# Patient Record
Sex: Male | Born: 1960 | Race: Black or African American | Hispanic: No | Marital: Married | Smoking: Former smoker
Health system: Southern US, Community
[De-identification: ages and names within clinical notes are randomized; demographics above are authoritative.]

## PROBLEM LIST (undated history)

## (undated) DIAGNOSIS — I639 Cerebral infarction, unspecified: Secondary | ICD-10-CM

## (undated) DIAGNOSIS — G4730 Sleep apnea, unspecified: Secondary | ICD-10-CM

## (undated) DIAGNOSIS — E119 Type 2 diabetes mellitus without complications: Secondary | ICD-10-CM

## (undated) DIAGNOSIS — G47419 Narcolepsy without cataplexy: Secondary | ICD-10-CM

## (undated) DIAGNOSIS — H409 Unspecified glaucoma: Secondary | ICD-10-CM

## (undated) DIAGNOSIS — I10 Essential (primary) hypertension: Secondary | ICD-10-CM

## (undated) HISTORY — PX: HAND TENDON SURGERY: SHX663

---

## 2008-10-22 ENCOUNTER — Ambulatory Visit: Payer: Self-pay | Admitting: Internal Medicine

## 2008-10-22 DIAGNOSIS — J90 Pleural effusion, not elsewhere classified: Secondary | ICD-10-CM | POA: Insufficient documentation

## 2008-10-22 DIAGNOSIS — I10 Essential (primary) hypertension: Secondary | ICD-10-CM | POA: Insufficient documentation

## 2008-10-27 LAB — CONVERTED CEMR LAB
Basophils Relative: 5.7 % — ABNORMAL HIGH (ref 0.0–3.0)
Eosinophils Absolute: 0.2 10*3/uL (ref 0.0–0.7)
HCT: 36.3 % — ABNORMAL LOW (ref 39.0–52.0)
Hemoglobin: 12.4 g/dL — ABNORMAL LOW (ref 13.0–17.0)
MCHC: 34.1 g/dL (ref 30.0–36.0)
MCV: 92.7 fL (ref 78.0–100.0)
Monocytes Absolute: 0.3 10*3/uL (ref 0.1–1.0)
Neutro Abs: 3.2 10*3/uL (ref 1.4–7.7)
RBC: 3.92 M/uL — ABNORMAL LOW (ref 4.22–5.81)

## 2008-11-07 ENCOUNTER — Ambulatory Visit: Payer: Self-pay | Admitting: Internal Medicine

## 2008-11-10 LAB — CONVERTED CEMR LAB
BUN: 23 mg/dL (ref 6–23)
Basophils Relative: 0.1 % (ref 0.0–3.0)
Creatinine, Ser: 1.2 mg/dL (ref 0.4–1.5)
Eosinophils Absolute: 0.1 10*3/uL (ref 0.0–0.7)
GFR calc non Af Amer: 83.27 mL/min (ref >60)
MCHC: 33.7 g/dL (ref 30.0–36.0)
MCV: 94.2 fL (ref 78.0–100.0)
Monocytes Absolute: 0.2 10*3/uL (ref 0.1–1.0)
Neutrophils Relative %: 46.3 % (ref 43.0–77.0)
Platelets: 235 10*3/uL (ref 150.0–400.0)
Potassium: 4.6 meq/L (ref 3.5–5.1)
RBC: 3.93 M/uL — ABNORMAL LOW (ref 4.22–5.81)
Sed Rate: 32 mm/hr — ABNORMAL HIGH (ref 0–22)

## 2008-12-12 ENCOUNTER — Ambulatory Visit: Payer: Self-pay | Admitting: Internal Medicine

## 2010-02-16 ENCOUNTER — Inpatient Hospital Stay (HOSPITAL_COMMUNITY)
Admission: EM | Admit: 2010-02-16 | Discharge: 2010-02-19 | Payer: Self-pay | Source: Home / Self Care | Attending: Internal Medicine | Admitting: Internal Medicine

## 2010-02-17 ENCOUNTER — Encounter (INDEPENDENT_AMBULATORY_CARE_PROVIDER_SITE_OTHER): Payer: Self-pay | Admitting: Emergency Medicine

## 2010-03-01 ENCOUNTER — Encounter
Admission: RE | Admit: 2010-03-01 | Discharge: 2010-03-30 | Payer: Self-pay | Source: Home / Self Care | Attending: Neurology | Admitting: Neurology

## 2010-03-22 NOTE — Discharge Summary (Addendum)
Cory Carpenter, Cory Carpenter                ACCOUNT NO.:  [id number]  MEDICAL RECORD NO.:  [id number]          PATIENT TYPE:  INP  LOCATION:  3009                         FACILITY:  [facility]  PHYSICIAN:  Isidor Holts, M.D.  DATE OF BIRTH:  10-14-1960  DATE OF ADMISSION:  02/16/2010 DATE OF DISCHARGE:  02/19/2010                              DISCHARGE SUMMARY   PRIMARY MEDICAL DOCTOR:  [place], [place].  DISCHARGE DIAGNOSES: 1. Subacute right frontoparietal cerebrovascular accident. 2. Dysphasia secondary to subacute right frontoparietal     cerebrovascular accident. 3. Type 2 diabetes mellitus. 4. Hyperosmolar nonketotic state secondary to above. 5. Dehydration/acute renal insufficiency secondary to above. 6. Hypertension. 7. Dyslipidemia.  PROCEDURES: 1. Chest x-ray on February 16, 2010.  This showed no acute bony     pathology.  There was chronic change. 2. Head CT scan on February 16, 2010.  This showed acute versus  subacute ischemic changes present in the right frontal and parietal     lobes.  This is most consistent with right ICA watershed infarcts. 3. Brain MRI on February 18, 2010.  This showed multiple areas of     acute infarction in the right middle cerebral artery territory,     mild swelling, and minimal petechial blood products.  No     significant mass effect or shift or evidence of frank hematoma. 4. Brain MRA on February 18, 2010.  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  There is     branch vessel visualization beyond that.  This could be due to     embolic or thrombotic disease approximately 50% stenosis of the A1     segment on the right.  Also middle cerebral artery branch stenosis     and the left posterior cerebral artery stenosis bilaterally. 5. Two-D echocardiogram on February 17, 2010.  This showed severely     dilated left ventricular cavity size, systolic function was normal.     Estimated ejection  fraction 60%-65%.  Wall motion was normal.  Left     ventricular diastolic function parameters were normal. 6. Transesophageal echocardiography on February 19, 2010 done by Dr.     Olga Millers.  This showed normal LV function and prominent     trabeculae at the LV apex, normal LA.  No left atrial appendage     thrombus.  Normal RA.  Normal RV.  No pericardial effusion.  No     aortic stenosis or insufficiency.  Normal atrial valve.  Trace     mitral regurgitation.  Normal tricuspid valve.  Trace tricuspid     regurgitation.  No pulmonic insufficiency.  No intraatrial shunt by     color Doppler.  Negative saline microcavitation study.  CONSULTATIONS: 1. C. Lesia Sago, MD, neurologist. 2. Madolyn Frieze. Jens Som, MD, [facility], cardiologist.  ADMISSION HISTORY:  As H&P notes of February 16, 2010, dictated by Dr. Marcellus Scott.  However, in brief, this is a 50 year old male, with known history of type 2 diabetes mellitus, hypertension, and history of left arm surgery for trauma, presenting with left  facial weakness and twitching and left hand weakness.  Reportedly, the patient is experiencing some slurring of speech and left-sided facial asymmetry approximately 2 weeks ago, associated with weakness in the left hand, and difficulyt with dressing himself.  This has become progressive.  On initial evaluation, the patient was found to have blood glucose of 677. Head CT scan demonstrated a right cerebral CVA.  The patient was therefore admitted for further evaluation, investigation, and management.  CLINICAL COURSE: 1. Subacute right MCA territory watershed infarct:  The patient     presented as described above.  Imaging studies including head CT     scan and brain MRI demonstrated a subacute CVA in the right MCA     territory with a watershed distribution.  For details, refer to     procedure list above.  The patient was placed on neuro checks, underwent     physiotherapy evaluation.  Risk  factor modification was pursued.     The patient was commenced on low-dose aspirin.  Initial neurology     consultation was provided by Dr. Lesia Sago, who was helpful in     directing management and therapy.  The patient has indeed made a     good recovery.  By February 20, 1999, he only had minimal mild     residual left facial asymmetry as well as minimal deficit in the     left upper extremity.  The patient has been evaluated by speech     pathologist and is recommended outpatient speech therapy for     cognition and dysarthria.  2. Dyslipidemia:  The patient's lipid profile are as follows.  Total     cholesterol 116, triglyceride 133, HDL 30, and LDL 59, i.e.,     excellent lipid profile.  He continues on preadmission lipid-     lowering treatment.  3. Hyperosmolar nonketotic state:  The patient presented with a     markedly elevated blood glucose of 677, BUN was 26, creatinine     1.56, and sodium 129.  Findings were consistent with hyperosmolar     nonketotic state.  It was managed with intravenous fluid hydration,     sliding-scale insulin coverage, as well as scheduled Lantus insulin.     By February 17, 2010, CBGs were ranging in the 200s-300s.  He was     recommenced on home Lantus regimen, as well as carbohydrate-modified     diet. We are pleased to note  that during the rest of the course     of the patient's hospitalization, his CBGs improved.  As a matter     of fact, it was 110 in the a.m. of February 19, 2010.  Hydration     status has since normalized.  As of February 18, 2010, his BUN was     13 and creatinine 0.96.  4. Hypertension:  The patient's blood pressure was initially mildly     elevated at the time of presentation.  Against a background of     recent acute CVA, it was deemed not to aggressively pursue blood     pressure control.  His lisinopril dosage was therefore reduced to 5     mg p.o. daily.  As of February 19, 2010, BP was 143/75.  His      lisinopril has been changed to 10 mg p.o. daily for now.  DISPOSITION:  The patient was on February 19, 2010, considered clinically stable for discharge.  He was therefore discharged  accordingly.  ACTIVITY:  As tolerated.  Recommended to increase activity slowly.  DIET:  Heart healthy/carbohydrate modified.  FOLLOWUP INSTRUCTIONS:  The patient is to follow up routinely with his primary MD at [facility], [place] per prior scheduled appointment.  He is to follow up with Dr. Delia Heady, [facility], i.e., stroke MD in 2 weeks.  Telephone number [phone].  SPECIAL INSTRUCTIONS:  Outpatient speech therapy for cognition and dysarthria have been arranged.     Isidor Holts, M.D.     CO/MEDQ  D:  02/19/2010  T:  02/20/2010  Job:  [id number]  cc:   [facility] [place] Pramod P. Pearlean Brownie, MD Nile Riggs, MD  Electronically Signed by Isidor Holts M.D. on 03/22/2010 07:19:34 PM

## 2010-03-25 NOTE — Consult Note (Addendum)
NAME:  Cory Carpenter, Cory Carpenter                ACCOUNT NO.:  [id number]  MEDICAL RECORD NO.:  [id number]          PATIENT TYPE:  INP  LOCATION:  3009                         FACILITY:  [facility]  PHYSICIAN:  Marlan Palau, M.D.  DATE OF BIRTH:  02/21/1961  DATE OF CONSULTATION:  02/16/2010 DATE OF DISCHARGE:                                CONSULTATION   TIME OF CONSULTATION:  2 p.m.  REASON FOR CONSULTATION:  Left arm weakness, left facial droop, and slurred speech.  HISTORY OF PRESENT ILLNESS:  This is a 50 year old African American male with hypertension, diabetes, and questionable hypercholesterolemia.  The patient's wife noted over the past week or so, her husband showed weakness and clumsiness in his left hand.  Inattentiveness and questionable periods of being in the daze.  In addition, the patient has also showed some slurred speech over the last week.  Initially, wife had brought these symptoms up to her husband who at that time had stated he was fine and that they did not go to the hospital.  Today, his wife continued to notice left hand clumsiness, left facial droop, and slurred speech along with additional symptom of facial twitching.  During this facial twitching, the patient was able to respond and follow all instructions.  No incontinence or tongue biting occurred.  Due to the addition of the facial twitching, the patient's wife called EMS, so that he may be seen at the emergency department.  At the present time, the patient presents with slurred speech, left hand clumsiness, and positive left facial droop.  It should be noted, the patient has not been on and is not on aspirin or antiplatelet.  The patient does not have a history of stroke in the past.  NIH stroke scale is 4.  Modified Rankin is 0.  IV t-PA was not given secondary to unknown time of onset, delay in arrival.  PAST MEDICAL HISTORY: 1. Hypertension. 2. Diabetes. 3. Hypercholesterolemia.  PAST SURGICAL  HISTORY:  Left wrist surgery secondary to accident at 50 years of age.  MEDICATIONS:  Gemfibrozil, glipizide, Lantus, lisinopril.  ALLERGIES:  No known drug allergies.  FAMILY HISTORY:  Hypertension, diabetes, renal failure, and asthma.  SOCIAL HISTORY:  The patient does not smoke.  He does not do any illicit drugs.  He lives with his wife.  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  REVIEW OF SYSTEMS:  Positive for weakness, polyuria, left hand clumsiness, left arm weakness, slurred speech.  PHYSICAL EXAMINATION:  VITAL SIGNS:  Blood pressure is 164/79, pulse 76, respirations 27, temperature 98.4, O2 sat 97% on room air. NEUROLOGIC:  The patient is alert and oriented x3.  Carries out two- and three-step commands without difficulty.  Pupils are equal, round, and reactive to light and accommodating, conjugate.  Extraocular movements are intact.  Visual fields are grossly intact.  Face shows a left facial droop that includes V1, V2, and V3 region.  Tongue is midline.  UA is midline.  Speech has slight dysarthria.  The patient states his facial sensation is intact bilaterally. Coordination:  The patient's finger-to-nose and heel-to-shin were smooth; however, I  did notice some clumsiness in his left finger-to- finger fine motor movements.  Motor shows 5/5 strength throughout.  Deep tendon reflexes 2+, downgoing toes bilaterally.  Drift:  The patient shows no drift in the upper or lower extremities.  Sensation is full to pinprick, light touch, and vibration throughout. PULMONARY:  Clear to auscultation bilaterally.  No rhonchi or wheezing. CARDIOVASCULAR:  S1 and S2 is audible.  Regular rate and rhythm. NECK:  Negative bruits and supple.  LABORATORY DATA:  UA shows positive for glucose, ketones.  Sodium 129, potassium 4.9, chloride 91, CO2 of 28, BUN 26, creatinine 1.56, glucose is 677, calcium 9.5.  White blood cell count 6.4, platelets 190, hemoglobin 14.5, hematocrit 40.5.  IMAGING:   CT of head without contrast shows acute or subacute ischemic changes in the right frontal and right parietal lobe, most notably in the right ICA watershed area.  ASSESSMENT:  This is a 50 year old male with right frontoparietal watershed distribution infarct in the setting of hyperglycemia and dehydration.  RECOMMENDATIONS AT THIS TIME:  Start aspirin 325 mg p.o. once he passes swallow screen stroke workup.  PT/OT and speech therapy evaluation. Stroke MD will follow in the morning.     Felicie Morn, PA-C   ______________________________ C. Lesia Sago, M.D.    DS/MEDQ  D:  02/16/2010  T:  02/17/2010  Job:  [id number]  cc:   Pramod P. Pearlean Brownie, MD  Electronically Signed by Felicie Morn PA-C on 02/24/2010 10:05:31 AM Electronically Signed by Thana Farr M.D. on 03/25/2010 08:50:28 AM

## 2010-04-02 ENCOUNTER — Ambulatory Visit: Payer: Federal, State, Local not specified - PPO | Attending: Neurology

## 2010-04-02 ENCOUNTER — Ambulatory Visit: Payer: Federal, State, Local not specified - PPO | Admitting: Occupational Therapy

## 2010-04-02 DIAGNOSIS — IMO0001 Reserved for inherently not codable concepts without codable children: Secondary | ICD-10-CM | POA: Insufficient documentation

## 2010-04-02 DIAGNOSIS — I69919 Unspecified symptoms and signs involving cognitive functions following unspecified cerebrovascular disease: Secondary | ICD-10-CM | POA: Insufficient documentation

## 2010-04-02 DIAGNOSIS — I69922 Dysarthria following unspecified cerebrovascular disease: Secondary | ICD-10-CM | POA: Insufficient documentation

## 2010-04-14 ENCOUNTER — Ambulatory Visit: Payer: Federal, State, Local not specified - PPO

## 2010-04-15 ENCOUNTER — Ambulatory Visit: Payer: Federal, State, Local not specified - PPO | Admitting: Occupational Therapy

## 2010-04-16 ENCOUNTER — Ambulatory Visit: Payer: Federal, State, Local not specified - PPO

## 2010-04-20 ENCOUNTER — Ambulatory Visit: Payer: Federal, State, Local not specified - PPO

## 2010-04-20 ENCOUNTER — Encounter: Payer: Federal, State, Local not specified - PPO | Admitting: Occupational Therapy

## 2010-04-21 ENCOUNTER — Encounter: Payer: Federal, State, Local not specified - PPO | Admitting: Occupational Therapy

## 2010-04-22 ENCOUNTER — Ambulatory Visit: Payer: Federal, State, Local not specified - PPO

## 2010-04-23 ENCOUNTER — Encounter: Payer: Federal, State, Local not specified - PPO | Admitting: Occupational Therapy

## 2010-04-27 ENCOUNTER — Encounter: Payer: Federal, State, Local not specified - PPO | Admitting: Occupational Therapy

## 2010-04-30 ENCOUNTER — Encounter: Payer: Federal, State, Local not specified - PPO | Admitting: Occupational Therapy

## 2010-05-04 ENCOUNTER — Encounter: Payer: Federal, State, Local not specified - PPO | Admitting: Occupational Therapy

## 2010-05-07 ENCOUNTER — Encounter: Payer: Federal, State, Local not specified - PPO | Admitting: Occupational Therapy

## 2010-05-10 ENCOUNTER — Encounter: Payer: Federal, State, Local not specified - PPO | Admitting: Occupational Therapy

## 2010-05-10 ENCOUNTER — Ambulatory Visit: Payer: Federal, State, Local not specified - PPO | Admitting: Occupational Therapy

## 2010-05-10 ENCOUNTER — Ambulatory Visit: Payer: Federal, State, Local not specified - PPO | Attending: Neurology

## 2010-05-10 DIAGNOSIS — I69922 Dysarthria following unspecified cerebrovascular disease: Secondary | ICD-10-CM | POA: Insufficient documentation

## 2010-05-10 DIAGNOSIS — IMO0001 Reserved for inherently not codable concepts without codable children: Secondary | ICD-10-CM | POA: Insufficient documentation

## 2010-05-10 DIAGNOSIS — I69919 Unspecified symptoms and signs involving cognitive functions following unspecified cerebrovascular disease: Secondary | ICD-10-CM | POA: Insufficient documentation

## 2010-05-10 LAB — CBC
HCT: 40.5 % (ref 39.0–52.0)
Hemoglobin: 14.5 g/dL (ref 13.0–17.0)
MCH: 30 pg (ref 26.0–34.0)
MCHC: 34.3 g/dL (ref 30.0–36.0)
MCV: 87.4 fL (ref 78.0–100.0)
Platelets: 188 10*3/uL (ref 150–400)
WBC: 6.4 10*3/uL (ref 4.0–10.5)

## 2010-05-10 LAB — BASIC METABOLIC PANEL
BUN: 13 mg/dL (ref 6–23)
BUN: 13 mg/dL (ref 6–23)
BUN: 13 mg/dL (ref 6–23)
BUN: 15 mg/dL (ref 6–23)
BUN: 20 mg/dL (ref 6–23)
CO2: 24 mEq/L (ref 19–32)
CO2: 24 mEq/L (ref 19–32)
CO2: 26 mEq/L (ref 19–32)
CO2: 27 mEq/L (ref 19–32)
CO2: 28 mEq/L (ref 19–32)
Calcium: 8 mg/dL — ABNORMAL LOW (ref 8.4–10.5)
Calcium: 8.2 mg/dL — ABNORMAL LOW (ref 8.4–10.5)
Calcium: 8.4 mg/dL (ref 8.4–10.5)
Chloride: 103 mEq/L (ref 96–112)
Chloride: 104 mEq/L (ref 96–112)
Chloride: 105 mEq/L (ref 96–112)
Chloride: 105 mEq/L (ref 96–112)
Chloride: 106 mEq/L (ref 96–112)
Chloride: 109 mEq/L (ref 96–112)
Chloride: 113 mEq/L — ABNORMAL HIGH (ref 96–112)
Creatinine, Ser: 1.05 mg/dL (ref 0.4–1.5)
Creatinine, Ser: 1.06 mg/dL (ref 0.4–1.5)
Creatinine, Ser: 1.11 mg/dL (ref 0.4–1.5)
Creatinine, Ser: 1.12 mg/dL (ref 0.4–1.5)
Creatinine, Ser: 1.14 mg/dL (ref 0.4–1.5)
Creatinine, Ser: 1.25 mg/dL (ref 0.4–1.5)
GFR calc Af Amer: 58 mL/min — ABNORMAL LOW (ref >60)
GFR calc Af Amer: >60 mL/min (ref >60)
GFR calc Af Amer: >60 mL/min (ref >60)
GFR calc Af Amer: >60 mL/min (ref >60)
GFR calc Af Amer: >60 mL/min (ref >60)
GFR calc non Af Amer: 48 mL/min — ABNORMAL LOW (ref >60)
GFR calc non Af Amer: >60 mL/min (ref >60)
GFR calc non Af Amer: >60 mL/min (ref >60)
GFR calc non Af Amer: >60 mL/min (ref >60)
GFR calc non Af Amer: >60 mL/min (ref >60)
Glucose, Bld: 108 mg/dL — ABNORMAL HIGH (ref 70–99)
Glucose, Bld: 110 mg/dL — ABNORMAL HIGH (ref 70–99)
Glucose, Bld: 200 mg/dL — ABNORMAL HIGH (ref 70–99)
Glucose, Bld: 311 mg/dL — ABNORMAL HIGH (ref 70–99)
Glucose, Bld: 315 mg/dL — ABNORMAL HIGH (ref 70–99)
Glucose, Bld: 398 mg/dL — ABNORMAL HIGH (ref 70–99)
Potassium: 3.7 mEq/L (ref 3.5–5.1)
Potassium: 4 mEq/L (ref 3.5–5.1)
Potassium: 4.1 mEq/L (ref 3.5–5.1)
Potassium: 4.1 mEq/L (ref 3.5–5.1)
Potassium: 4.2 mEq/L (ref 3.5–5.1)
Potassium: 4.4 mEq/L (ref 3.5–5.1)
Potassium: 4.7 mEq/L (ref 3.5–5.1)
Potassium: 4.9 mEq/L (ref 3.5–5.1)
Sodium: 129 mEq/L — ABNORMAL LOW (ref 135–145)
Sodium: 134 mEq/L — ABNORMAL LOW (ref 135–145)
Sodium: 136 mEq/L (ref 135–145)
Sodium: 137 mEq/L (ref 135–145)
Sodium: 138 mEq/L (ref 135–145)
Sodium: 139 mEq/L (ref 135–145)

## 2010-05-10 LAB — RAPID URINE DRUG SCREEN, HOSP PERFORMED
Amphetamines: NOT DETECTED
Barbiturates: NOT DETECTED
Cocaine: NOT DETECTED
Opiates: NOT DETECTED
Tetrahydrocannabinol: NOT DETECTED

## 2010-05-10 LAB — DIFFERENTIAL
Basophils Absolute: 0 10*3/uL (ref 0.0–0.1)
Lymphocytes Relative: 39 % (ref 12–46)
Monocytes Absolute: 0.2 10*3/uL (ref 0.1–1.0)
Monocytes Relative: 4 % (ref 3–12)
Neutro Abs: 3.6 10*3/uL (ref 1.7–7.7)
Neutrophils Relative %: 56 % (ref 43–77)

## 2010-05-10 LAB — HEPATIC FUNCTION PANEL
ALT: 19 U/L (ref 0–53)
AST: 25 U/L (ref 0–37)
Alkaline Phosphatase: 88 U/L (ref 39–117)
Indirect Bilirubin: 0.3 mg/dL (ref 0.3–0.9)
Total Protein: 7.3 g/dL (ref 6.0–8.3)

## 2010-05-10 LAB — GLUCOSE, CAPILLARY
Glucose-Capillary: 129 mg/dL — ABNORMAL HIGH (ref 70–99)
Glucose-Capillary: 222 mg/dL — ABNORMAL HIGH (ref 70–99)
Glucose-Capillary: 253 mg/dL — ABNORMAL HIGH (ref 70–99)
Glucose-Capillary: 293 mg/dL — ABNORMAL HIGH (ref 70–99)
Glucose-Capillary: 337 mg/dL — ABNORMAL HIGH (ref 70–99)
Glucose-Capillary: 368 mg/dL — ABNORMAL HIGH (ref 70–99)
Glucose-Capillary: 82 mg/dL (ref 70–99)
Glucose-Capillary: 98 mg/dL (ref 70–99)

## 2010-05-10 LAB — CARDIAC PANEL(CRET KIN+CKTOT+MB+TROPI)
CK, MB: 1.2 ng/mL (ref 0.3–4.0)
CK, MB: 1.3 ng/mL (ref 0.3–4.0)
Relative Index: 0.8 (ref 0.0–2.5)
Total CK: 149 U/L (ref 7–232)
Total CK: 167 U/L (ref 7–232)
Troponin I: 0.02 ng/mL (ref 0.00–0.06)
Troponin I: 0.03 ng/mL (ref 0.00–0.06)

## 2010-05-10 LAB — URINALYSIS, ROUTINE W REFLEX MICROSCOPIC
Bilirubin Urine: NEGATIVE
Ketones, ur: 15 mg/dL — AB
Nitrite: NEGATIVE
Protein, ur: NEGATIVE mg/dL
Specific Gravity, Urine: 1.026 (ref 1.005–1.030)
Urobilinogen, UA: 0.2 mg/dL (ref 0.0–1.0)

## 2010-05-10 LAB — POCT CARDIAC MARKERS
CKMB, poc: 2.7 ng/mL (ref 1.0–8.0)
Myoglobin, poc: 233 ng/mL (ref 12–200)
Troponin i, poc: <0.05 ng/mL (ref 0.00–0.09)

## 2010-05-10 LAB — URINE CULTURE: Culture: NO GROWTH

## 2010-05-10 LAB — PROTIME-INR
INR: 0.93 (ref 0.00–1.49)
Prothrombin Time: 12.7 seconds (ref 11.6–15.2)

## 2010-05-10 LAB — LIPID PANEL
Cholesterol: 116 mg/dL (ref 0–200)
HDL: 30 mg/dL — ABNORMAL LOW (ref >39)

## 2010-05-10 LAB — TROPONIN I: Troponin I: 0.05 ng/mL (ref 0.00–0.06)

## 2010-05-11 ENCOUNTER — Ambulatory Visit: Payer: Federal, State, Local not specified - PPO

## 2010-05-11 ENCOUNTER — Ambulatory Visit: Payer: Federal, State, Local not specified - PPO | Admitting: Occupational Therapy

## 2010-05-18 ENCOUNTER — Ambulatory Visit: Payer: Federal, State, Local not specified - PPO

## 2010-05-18 ENCOUNTER — Encounter: Payer: Federal, State, Local not specified - PPO | Admitting: Occupational Therapy

## 2010-05-20 ENCOUNTER — Ambulatory Visit: Payer: Federal, State, Local not specified - PPO

## 2010-05-20 ENCOUNTER — Ambulatory Visit: Payer: Federal, State, Local not specified - PPO | Admitting: Occupational Therapy

## 2016-01-17 ENCOUNTER — Emergency Department (HOSPITAL_COMMUNITY): Payer: Federal, State, Local not specified - PPO

## 2016-01-17 ENCOUNTER — Encounter (HOSPITAL_COMMUNITY): Payer: Self-pay

## 2016-01-17 ENCOUNTER — Inpatient Hospital Stay (HOSPITAL_COMMUNITY)
Admission: EM | Admit: 2016-01-17 | Discharge: 2016-01-19 | DRG: 066 | Disposition: A | Discharge status: Home / Self Care | Payer: Federal, State, Local not specified - PPO | Attending: Internal Medicine | Admitting: Internal Medicine

## 2016-01-17 DIAGNOSIS — G4733 Obstructive sleep apnea (adult) (pediatric): Secondary | ICD-10-CM | POA: Diagnosis present

## 2016-01-17 DIAGNOSIS — Z9114 Patient's other noncompliance with medication regimen: Secondary | ICD-10-CM | POA: Diagnosis not present

## 2016-01-17 DIAGNOSIS — R29810 Facial weakness: Secondary | ICD-10-CM | POA: Diagnosis present

## 2016-01-17 DIAGNOSIS — R4781 Slurred speech: Secondary | ICD-10-CM | POA: Diagnosis present

## 2016-01-17 DIAGNOSIS — Z833 Family history of diabetes mellitus: Secondary | ICD-10-CM | POA: Diagnosis not present

## 2016-01-17 DIAGNOSIS — E1165 Type 2 diabetes mellitus with hyperglycemia: Secondary | ICD-10-CM | POA: Diagnosis present

## 2016-01-17 DIAGNOSIS — E1122 Type 2 diabetes mellitus with diabetic chronic kidney disease: Secondary | ICD-10-CM | POA: Diagnosis present

## 2016-01-17 DIAGNOSIS — Z91148 Patient's other noncompliance with medication regimen for other reason: Secondary | ICD-10-CM

## 2016-01-17 DIAGNOSIS — Z87891 Personal history of nicotine dependence: Secondary | ICD-10-CM | POA: Diagnosis not present

## 2016-01-17 DIAGNOSIS — I639 Cerebral infarction, unspecified: Secondary | ICD-10-CM | POA: Diagnosis present

## 2016-01-17 DIAGNOSIS — G47419 Narcolepsy without cataplexy: Secondary | ICD-10-CM | POA: Diagnosis present

## 2016-01-17 DIAGNOSIS — I129 Hypertensive chronic kidney disease with stage 1 through stage 4 chronic kidney disease, or unspecified chronic kidney disease: Secondary | ICD-10-CM | POA: Diagnosis present

## 2016-01-17 DIAGNOSIS — E1349 Other specified diabetes mellitus with other diabetic neurological complication: Secondary | ICD-10-CM | POA: Diagnosis not present

## 2016-01-17 DIAGNOSIS — I6521 Occlusion and stenosis of right carotid artery: Secondary | ICD-10-CM

## 2016-01-17 DIAGNOSIS — H5462 Unqualified visual loss, left eye, normal vision right eye: Secondary | ICD-10-CM | POA: Diagnosis present

## 2016-01-17 DIAGNOSIS — N289 Disorder of kidney and ureter, unspecified: Secondary | ICD-10-CM

## 2016-01-17 DIAGNOSIS — E1365 Other specified diabetes mellitus with hyperglycemia: Secondary | ICD-10-CM | POA: Diagnosis not present

## 2016-01-17 DIAGNOSIS — H409 Unspecified glaucoma: Secondary | ICD-10-CM | POA: Diagnosis present

## 2016-01-17 DIAGNOSIS — R29702 NIHSS score 2: Secondary | ICD-10-CM | POA: Diagnosis present

## 2016-01-17 DIAGNOSIS — Z7982 Long term (current) use of aspirin: Secondary | ICD-10-CM | POA: Diagnosis not present

## 2016-01-17 DIAGNOSIS — Z7901 Long term (current) use of anticoagulants: Secondary | ICD-10-CM

## 2016-01-17 DIAGNOSIS — N182 Chronic kidney disease, stage 2 (mild): Secondary | ICD-10-CM | POA: Diagnosis present

## 2016-01-17 DIAGNOSIS — I63411 Cerebral infarction due to embolism of right middle cerebral artery: Secondary | ICD-10-CM | POA: Diagnosis not present

## 2016-01-17 DIAGNOSIS — I69311 Memory deficit following cerebral infarction: Secondary | ICD-10-CM

## 2016-01-17 DIAGNOSIS — Z8249 Family history of ischemic heart disease and other diseases of the circulatory system: Secondary | ICD-10-CM | POA: Diagnosis not present

## 2016-01-17 DIAGNOSIS — Z8673 Personal history of transient ischemic attack (TIA), and cerebral infarction without residual deficits: Secondary | ICD-10-CM | POA: Diagnosis not present

## 2016-01-17 DIAGNOSIS — I6789 Other cerebrovascular disease: Secondary | ICD-10-CM | POA: Diagnosis not present

## 2016-01-17 DIAGNOSIS — I10 Essential (primary) hypertension: Secondary | ICD-10-CM | POA: Diagnosis not present

## 2016-01-17 DIAGNOSIS — Z794 Long term (current) use of insulin: Secondary | ICD-10-CM

## 2016-01-17 DIAGNOSIS — IMO0002 Reserved for concepts with insufficient information to code with codable children: Secondary | ICD-10-CM

## 2016-01-17 DIAGNOSIS — I63511 Cerebral infarction due to unspecified occlusion or stenosis of right middle cerebral artery: Secondary | ICD-10-CM | POA: Diagnosis not present

## 2016-01-17 HISTORY — DX: Narcolepsy without cataplexy: G47.419

## 2016-01-17 HISTORY — DX: Cerebral infarction, unspecified: I63.9

## 2016-01-17 HISTORY — DX: Sleep apnea, unspecified: G47.30

## 2016-01-17 HISTORY — DX: Essential (primary) hypertension: I10

## 2016-01-17 HISTORY — DX: Unspecified glaucoma: H40.9

## 2016-01-17 HISTORY — DX: Type 2 diabetes mellitus without complications: E11.9

## 2016-01-17 LAB — COMPREHENSIVE METABOLIC PANEL
ALBUMIN: 3.5 g/dL (ref 3.5–5.0)
ALK PHOS: 83 U/L (ref 38–126)
ALT: 15 U/L — ABNORMAL LOW (ref 17–63)
ANION GAP: 8 (ref 5–15)
AST: 18 U/L (ref 15–41)
BUN: 24 mg/dL — ABNORMAL HIGH (ref 6–20)
CHLORIDE: 98 mmol/L — AB (ref 101–111)
CO2: 28 mmol/L (ref 22–32)
Calcium: 9 mg/dL (ref 8.9–10.3)
Creatinine, Ser: 1.55 mg/dL — ABNORMAL HIGH (ref 0.61–1.24)
GFR calc non Af Amer: 49 mL/min — ABNORMAL LOW (ref >60)
GFR, EST AFRICAN AMERICAN: 57 mL/min — AB (ref >60)
GLUCOSE: 476 mg/dL — AB (ref 65–99)
POTASSIUM: 4.4 mmol/L (ref 3.5–5.1)
SODIUM: 134 mmol/L — AB (ref 135–145)
Total Bilirubin: 0.3 mg/dL (ref 0.3–1.2)
Total Protein: 6.9 g/dL (ref 6.5–8.1)

## 2016-01-17 LAB — DIFFERENTIAL
BASOS PCT: 0 %
Basophils Absolute: 0 10*3/uL (ref 0.0–0.1)
EOS ABS: 0.1 10*3/uL (ref 0.0–0.7)
EOS PCT: 2 %
LYMPHS PCT: 46 %
Lymphs Abs: 2.2 10*3/uL (ref 0.7–4.0)
Monocytes Absolute: 0.2 10*3/uL (ref 0.1–1.0)
Monocytes Relative: 5 %
NEUTROS PCT: 47 %
Neutro Abs: 2.2 10*3/uL (ref 1.7–7.7)

## 2016-01-17 LAB — CBC
HCT: 37.7 % — ABNORMAL LOW (ref 39.0–52.0)
Hemoglobin: 12.9 g/dL — ABNORMAL LOW (ref 13.0–17.0)
MCH: 30.1 pg (ref 26.0–34.0)
MCHC: 34.2 g/dL (ref 30.0–36.0)
MCV: 87.9 fL (ref 78.0–100.0)
PLATELETS: 233 10*3/uL (ref 150–400)
RBC: 4.29 MIL/uL (ref 4.22–5.81)
RDW: 11.4 % — AB (ref 11.5–15.5)
WBC: 4.6 10*3/uL (ref 4.0–10.5)

## 2016-01-17 LAB — I-STAT TROPONIN, ED: Troponin i, poc: 0.01 ng/mL (ref 0.00–0.08)

## 2016-01-17 LAB — I-STAT CHEM 8, ED
BUN: 27 mg/dL — ABNORMAL HIGH (ref 6–20)
CHLORIDE: 97 mmol/L — AB (ref 101–111)
Calcium, Ion: 1.18 mmol/L (ref 1.15–1.40)
Creatinine, Ser: 1.3 mg/dL — ABNORMAL HIGH (ref 0.61–1.24)
Glucose, Bld: 451 mg/dL — ABNORMAL HIGH (ref 65–99)
HEMATOCRIT: 40 % (ref 39.0–52.0)
HEMOGLOBIN: 13.6 g/dL (ref 13.0–17.0)
POTASSIUM: 4.4 mmol/L (ref 3.5–5.1)
SODIUM: 136 mmol/L (ref 135–145)
TCO2: 27 mmol/L (ref 0–100)

## 2016-01-17 LAB — PROTIME-INR
INR: 1
PROTHROMBIN TIME: 13.2 s (ref 11.4–15.2)

## 2016-01-17 LAB — CBG MONITORING, ED
GLUCOSE-CAPILLARY: 391 mg/dL — AB (ref 65–99)
GLUCOSE-CAPILLARY: 140 mg/dL — AB (ref 65–99)

## 2016-01-17 LAB — APTT: aPTT: 28 seconds (ref 24–36)

## 2016-01-17 MED ORDER — SENNOSIDES-DOCUSATE SODIUM 8.6-50 MG PO TABS: 1.0000 | ORAL_TABLET | Freq: Every evening | ORAL | Status: DC | PRN

## 2016-01-17 MED ORDER — LISINOPRIL 20 MG PO TABS: 40.0000 mg | ORAL_TABLET | Freq: Every day | ORAL | Status: DC

## 2016-01-17 MED ORDER — STROKE: EARLY STAGES OF RECOVERY BOOK
Freq: Once | Status: AC
  Administered 2016-01-18: 01:00:00
  Filled 2016-01-17: qty 1

## 2016-01-17 MED ORDER — SODIUM CHLORIDE 0.9 % IV BOLUS (SEPSIS)
1000.0000 mL | Freq: Once | INTRAVENOUS | Status: AC
  Administered 2016-01-17: 1000 mL via INTRAVENOUS

## 2016-01-17 MED ORDER — INSULIN ASPART 100 UNIT/ML ~~LOC~~ SOLN
0.0000 [IU] | Freq: Three times a day (TID) | SUBCUTANEOUS | Status: DC
  Administered 2016-01-18 – 2016-01-19 (×4): 8 [IU] via SUBCUTANEOUS

## 2016-01-17 MED ORDER — VITAMIN D 1000 UNITS PO TABS
1000.0000 [IU] | ORAL_TABLET | Freq: Every day | ORAL | Status: DC
  Administered 2016-01-18 – 2016-01-19 (×2): 1000 [IU] via ORAL
  Filled 2016-01-17 (×2): qty 1

## 2016-01-17 MED ORDER — PRAVASTATIN SODIUM 10 MG PO TABS
10.0000 mg | ORAL_TABLET | Freq: Every day | ORAL | Status: DC
  Administered 2016-01-18: 10 mg via ORAL
  Filled 2016-01-17: qty 1

## 2016-01-17 MED ORDER — INSULIN GLARGINE 100 UNIT/ML ~~LOC~~ SOLN
50.0000 [IU] | Freq: Every day | SUBCUTANEOUS | Status: DC
  Administered 2016-01-18: 50 [IU] via SUBCUTANEOUS
  Filled 2016-01-17: qty 0.5

## 2016-01-17 MED ORDER — BRIMONIDINE TARTRATE 0.2 % OP SOLN
1.0000 [drp] | Freq: Three times a day (TID) | OPHTHALMIC | Status: DC
  Administered 2016-01-18 – 2016-01-19 (×4): 1 [drp] via OPHTHALMIC
  Filled 2016-01-17: qty 5

## 2016-01-17 MED ORDER — INSULIN ASPART 100 UNIT/ML ~~LOC~~ SOLN: 0.0000 [IU] | Freq: Every day | SUBCUTANEOUS | Status: DC

## 2016-01-17 MED ORDER — SODIUM CHLORIDE 0.9 % IV SOLN: INTRAVENOUS | Status: DC

## 2016-01-17 MED ORDER — DORZOLAMIDE HCL-TIMOLOL MAL 2-0.5 % OP SOLN
1.0000 [drp] | Freq: Two times a day (BID) | OPHTHALMIC | Status: DC
  Administered 2016-01-18 – 2016-01-19 (×3): 1 [drp] via OPHTHALMIC
  Filled 2016-01-17: qty 10

## 2016-01-17 MED ORDER — HEPARIN SODIUM (PORCINE) 5000 UNIT/ML IJ SOLN
5000.0000 [IU] | Freq: Three times a day (TID) | INTRAMUSCULAR | Status: DC
  Administered 2016-01-18 – 2016-01-19 (×6): 5000 [IU] via SUBCUTANEOUS
  Filled 2016-01-17 (×6): qty 1

## 2016-01-17 MED ORDER — INSULIN ASPART 100 UNIT/ML ~~LOC~~ SOLN
8.0000 [IU] | Freq: Once | SUBCUTANEOUS | Status: AC
  Administered 2016-01-17: 8 [IU] via SUBCUTANEOUS
  Filled 2016-01-17: qty 1

## 2016-01-17 MED ORDER — INSULIN ASPART 100 UNIT/ML ~~LOC~~ SOLN
20.0000 [IU] | Freq: Three times a day (TID) | SUBCUTANEOUS | Status: DC
  Administered 2016-01-18 (×2): 20 [IU] via SUBCUTANEOUS

## 2016-01-17 NOTE — ED Provider Notes (Signed)
[facility] DEPT Provider Note   CSN: [id number] Arrival date & time: 01/17/16  1657   History   Chief Complaint Chief Complaint  Patient presents with  . Neurologic Problem  HPI   Cory Carpenter is a 55 y.o. male with PMH of DM, HTN, sleep apnea, narcolepsy, and R MCA stroke in 2011 who presents with slurred speech, slower than normal movement, and increased sleepiness x 1 week. Patient is here with his wife who is concerned about him. Of note he had a previous stroke which left him with some left facial dropping and cognitive impairment. Patient's wife states that he usually has a beard, since he shaved his bears his left sided facial droopiness is more pronounced. Wife states that he is just not acting himself. Patient also thinks that his facial droopiness is slightly worse as he has noticed that he drools more on his pillow.   Denies any nausea, vomiting, diarrhea, constipation, runny nose, sore throat, chest pain, abdominal pain, dysuria.   Admits to some polyuria.  Past Medical History:  Diagnosis Date  . Diabetes mellitus without complication ([place])   . Glaucoma   . Hypertension   . Narcolepsy   . Sleep apnea   . Stroke [facility])     Patient Active Problem List   Diagnosis Date Noted  . HYPERTENSION 10/22/2008  . PLEURAL EFFUSION 10/22/2008    History reviewed. No pertinent surgical history.  Home Medications    Prior to Admission medications   Not on File    Family History History reviewed. No pertinent family history.  Social History Social History  Substance Use Topics  . Smoking status: Former Research scientist (life sciences)  . Smokeless tobacco: Never Used  . Alcohol use Yes     Comment: occ      Allergies   Patient has no known allergies.   Review of Systems Review of Systems  10 Systems reviewed and are negative for acute change except as noted in the HPI.   Physical Exam Updated Vital Signs BP 140/80   Pulse (!) 56   Temp 98.6 F (37 C)   Resp (!) 27   Ht  5\' 9"  (1.753 m)   Wt 102.1 kg   SpO2 96%   BMI 33.23 kg/m   Physical Exam  Constitutional: He is oriented to person, place, and time. He appears well-developed and well-nourished.  HENT:  Head: Normocephalic.  Right Ear: External ear normal.  Left Ear: External ear normal.  Nose: Nose normal.  Dry mucous membranes  Eyes: Conjunctivae and EOM are normal. Pupils are equal, round, and reactive to light.  Neck: Normal range of motion. Neck supple.  Cardiovascular: Normal rate, regular rhythm, normal heart sounds and intact distal pulses.   Pulmonary/Chest: Effort normal and breath sounds normal. No respiratory distress. He has no wheezes.  Abdominal: Soft. Bowel sounds are normal. He exhibits no distension. There is no tenderness.  Musculoskeletal: Normal range of motion. He exhibits no edema, tenderness or deformity.  Neurological: He is alert and oriented to person, place, and time. He has normal strength. He displays no tremor. A cranial nerve deficit (CN VII deficit. Assymetric smile with left sided facial droop. Forehead distribution intact) is present. No sensory deficit. He exhibits normal muscle tone. He displays a negative Romberg sign. Coordination and gait normal.  Normal finger to nose testing bilaterally  Skin: Skin is warm and dry. Capillary refill takes less than 2 seconds.  Psychiatric: He has a normal mood and affect. His behavior is  normal. Judgment and thought content normal.     ED Treatments / Results  Labs (all labs ordered are listed, but only abnormal results are displayed) Labs Reviewed  CBC - Abnormal; Notable for the following:       Result Value   Hemoglobin 12.9 (*)    HCT 37.7 (*)    RDW 11.4 (*)    All other components within normal limits  COMPREHENSIVE METABOLIC PANEL - Abnormal; Notable for the following:    Sodium 134 (*)    Chloride 98 (*)    Glucose, Bld 476 (*)    BUN 24 (*)    Creatinine, Ser 1.55 (*)    ALT 15 (*)    GFR calc non Af Amer  49 (*)    GFR calc Af Amer 57 (*)    All other components within normal limits  CBG MONITORING, ED - Abnormal; Notable for the following:    Glucose-Capillary 391 (*)    All other components within normal limits  I-STAT CHEM 8, ED - Abnormal; Notable for the following:    Chloride 97 (*)    BUN 27 (*)    Creatinine, Ser 1.30 (*)    Glucose, Bld 451 (*)    All other components within normal limits  CBG MONITORING, ED - Abnormal; Notable for the following:    Glucose-Capillary 140 (*)    All other components within normal limits  PROTIME-INR  APTT  DIFFERENTIAL  URINALYSIS, ROUTINE W REFLEX MICROSCOPIC (NOT AT [facility])  I-STAT TROPOININ, ED    EKG  EKG Interpretation None       Radiology Ct Head Wo Contrast  Result Date: 01/17/2016 CLINICAL DATA:  Left facial droop and intermittent slurred speech for 1 week. EXAM: CT HEAD WITHOUT CONTRAST TECHNIQUE: Contiguous axial images were obtained from the base of the skull through the vertex without intravenous contrast. COMPARISON:  Head CT scan 02/16/2010 and brain MRI 02/18/2010. FINDINGS: Brain: Multiple areas of remote infarct in the right MCA territory are identified as seen on the prior examination. No evidence of acute infarction, hemorrhage, mass lesion, mass effect, midline shift or abnormal extra-axial fluid collection. No hydrocephalus or pneumocephalus. Vascular: Extensive atherosclerotic vascular disease is seen. Skull: Intact. Sinuses/Orbits: Unremarkable. Other: None. IMPRESSION: No acute abnormality. Multiple areas of remote infarction in the right MCA territory. Atherosclerosis. Electronically Signed   By: Inge Rise M.D.   On: 01/17/2016 18:57   Mr Brain Wo Contrast  Result Date: 01/17/2016 CLINICAL DATA:  55 year old male with left facial droop for 1 week. Initial encounter. EXAM: MRI HEAD WITHOUT CONTRAST TECHNIQUE: Multiplanar, multiecho pulse sequences of the brain and surrounding structures were obtained without  intravenous contrast. COMPARISON:  Head CT without contrast 1841 hours today. Brain MRI 02/18/2010. FINDINGS: Brain: Patchy and confluent areas of chronic encephalomalacia in the right hemisphere with superimposed scattered foci of restricted diffusion in the middle and posterior right MCA territories. There is also an isodense diffusion abnormality with swelling and confluent T2 and FLAIR hyperintensity in the anterior right basal ganglia. There is associated petechial hemorrhage in the acute and subacute areas as well superimposed chronic hemosiderin. No intracranial mass effect. No left hemisphere or posterior fossa restricted diffusion. No ventriculomegaly. Negative pituitary and cervicomedullary junction. No extra-axial collection. Left basal ganglia, thalami, brainstem, and cerebellum remain normal. Minimal nonspecific white matter signal changes in the left hemisphere. Vascular: Major intracranial vascular flow voids are stable since 2011. Skull and upper cervical spine: Negative. Stable bone marrow signal since  2011. Sinuses/Orbits: Interval postoperative changes to the left globe. Otherwise negative. Mild paranasal sinus mucosal thickening and mild left mastoid effusion are stable. Other: Visible internal auditory structures appear normal. Negative scalp soft tissues. IMPRESSION: Acute and subacute on chronic right MCA territory infarcts. Petechial hemorrhage but no malignant hemorrhagic transformation or intracranial mass effect. Electronically Signed   By: Genevie Ann M.D.   On: 01/17/2016 21:38    Procedures Procedures (including critical care time)  Medications Ordered in ED Medications  sodium chloride 0.9 % bolus 1,000 mL (1,000 mLs Intravenous New Bag/Given 01/17/16 2035)  insulin aspart (novoLOG) injection 8 Units (8 Units Subcutaneous Given 01/17/16 2008)     Initial Impression / Assessment and Plan / ED Course  I have reviewed the triage vital signs and the nursing notes.  Pertinent  labs & imaging results that were available during my care of the patient were reviewed by me and considered in my medical decision making (see chart for details).  Clinical Course    Nicki Schnider is a 55 y.o. male with PMH of DM, HTN, sleep apnea, narcolepsy, and R MCA stroke in 2011 who presents with slurred speech, slower than normal movement, and increased sleepiness x 1 week.   Vital signs within normal limits. Physical exam showing dry mucous membranes. Neurological exam intact with the exception of asymetric smile on the left and mildly slurred speech (Patient has known left facial drooping after his last stroke in 2011).   Labs showing signs of dehydration with a Cr of 1.55 and BUN of 24. Glucose 476, no signs of DKA with normal anion gap. 1 L NS bolus and 8 units of novolog given. Glucose came down to 140 1 hour Novolog given.   CT Head showing no acute infarct and old R MCA infarct. MRI brain showing acute and subacute on chronic right MCA territory infarcts.   Discussed patient with neurologist on call, he will come see the patient. Appreciate his assistance.   Discussed patient with hospitalist Dr. Eliseo Squires who will admit patient, appreciate her assistance.    Final Clinical Impressions(s) / ED Diagnoses   Final diagnoses:  Cerebrovascular accident (CVA), unspecified mechanism [facility])    New Prescriptions New Prescriptions   No medications on file     Carlyle Dolly, MD 01/17/16 2233    Elnora Morrison, MD 01/18/16 [phone]

## 2016-01-17 NOTE — ED Notes (Signed)
PT A&Ox4. NIH score: 1  Swallow screen passed.  Last neuro check completed 2201.  Ambulatory with standby assist.  Family at bedside.  Cari Caraway., RN, CB#: [phone].

## 2016-01-17 NOTE — ED Notes (Signed)
Pt to MRI

## 2016-01-17 NOTE — ED Triage Notes (Signed)
Onset 1 week wife has noticed pt has left facial droop, intermittant slurred speech, walking slower than normal, and sleeping has increased.

## 2016-01-17 NOTE — H&P (Signed)
History and Physical    Cory Carpenter [id number] DOB: 08-24-1960 DOA: 01/17/2016  Referring MD/NP/PA: er PCP: [facility]ists:  Patient coming from: home  Chief Complaint: One week of left facial droop  HPI: Cory Carpenter is a 55 y.o. male with medical history significant of uncontrolled diabetes, hypertension, sleep apnea (not wearing C Pap for last few months).  About a week ago wife noticed the patient had a left facial droop and then was sleepy or than he normally was. Today wife states that patient had a episode of slurred speech on the phone. Patient admits that his blood glucose meter has been broken for the last several months and he has not been checking his blood sugars at home. He states that he's been checking his blood sugars on his meter at work and they have been reading as "high".    ED Course: In the ER an MRI was done that showed new as well as chronic CVAs. Hospitalists was asked to admit with neurology consult.  Review of Systems: all systems reviewed, negative unless stated above in HPI   Past Medical History:  Diagnosis Date  . Diabetes mellitus without complication ([place])   . Glaucoma   . Hypertension   . Narcolepsy   . Sleep apnea   . Stroke [facility])     History reviewed. No pertinent surgical history.   reports that he has quit smoking. He has never used smokeless tobacco. He reports that he drinks alcohol. He reports that he does not use drugs.  No Known Allergies  Family Hx of DM, HTN  Prior to Admission medications   Medication Sig Start Date End Date Taking? Authorizing Provider  brimonidine (ALPHAGAN) 0.2 % ophthalmic solution Place 1 drop into the left eye 3 (three) times daily.   Yes Historical Provider, MD  cholecalciferol (VITAMIN D) 1000 units tablet Take 1,000 Units by mouth daily.   Yes Historical Provider, MD  dorzolamide-timolol (COSOPT) 22.3-6.8 MG/ML ophthalmic solution Place 1 drop into the left eye 2 (two)  times daily.   Yes Historical Provider, MD  insulin aspart (NOVOLOG) 100 UNIT/ML injection Inject 20 Units into the skin 3 (three) times daily before meals.   Yes Historical Provider, MD  insulin glargine (LANTUS) 100 UNIT/ML injection Inject 50 Units into the skin daily.   Yes Historical Provider, MD  lisinopril (PRINIVIL,ZESTRIL) 40 MG tablet Take 40 mg by mouth daily.   Yes Historical Provider, MD  metFORMIN (GLUCOPHAGE-XR) 500 MG 24 hr tablet Take 1,000 mg by mouth 2 (two) times daily.   Yes Historical Provider, MD  pravastatin (PRAVACHOL) 10 MG tablet Take 10 mg by mouth at bedtime.   Yes Historical Provider, MD    Physical Exam: Vitals:   01/17/16 1714 01/17/16 1717 01/17/16 2015 01/17/16 2150  BP: 143/71  140/80   Pulse: 70  (!) 56   Resp: 12  (!) 27   Temp: 98.1 F (36.7 C)   98.6 F (37 C)  SpO2: 100%  96%   Weight:  102.1 kg (225 lb)    Height:  5\' 9"  (1.753 m)        Constitutional: NAD, calm, comfortable Vitals:   01/17/16 1714 01/17/16 1717 01/17/16 2015 01/17/16 2150  BP: 143/71  140/80   Pulse: 70  (!) 56   Resp: 12  (!) 27   Temp: 98.1 F (36.7 C)   98.6 F (37 C)  SpO2: 100%  96%   Weight:  102.1 kg (225 lb)  Height:  5\' 9"  (1.753 m)     Eyes: PERRL, lids and conjunctivae normal ENMT: Mucous membranes are moist. Posterior pharynx clear of any exudate or lesions.Normal dentition.  Neck: normal, supple, no masses, no thyromegaly Respiratory: clear to auscultation bilaterally, no wheezing, no crackles. Normal respiratory effort. No accessory muscle use.  Cardiovascular: Regular rate and rhythm, no murmurs / rubs / gallops. No extremity edema. 2+ pedal pulses. No carotid bruits.  Abdomen: no tenderness, no masses palpated. No hepatosplenomegaly. Bowel sounds positive.  Musculoskeletal: no clubbing / cyanosis. No joint deformity upper and lower extremities. Good ROM, no contractures. Normal muscle tone.  Skin: no rashes, lesions, ulcers. No  induration Neurologic: asymmetric smile with left facial droop, slurred speech, moves all 4 ext Psychiatric: Normal judgment and insight. Alert and oriented x 3. Normal mood.    Labs on Admission: I have personally reviewed following labs and imaging studies  CBC:  Recent Labs Lab 01/17/16 1721 01/17/16 1743  WBC 4.6  --   NEUTROABS 2.2  --   HGB 12.9* 13.6  HCT 37.7* 40.0  MCV 87.9  --   PLT 233  --    Basic Metabolic Panel:  Recent Labs Lab 01/17/16 1721 01/17/16 1743  NA 134* 136  K 4.4 4.4  CL 98* 97*  CO2 28  --   GLUCOSE 476* 451*  BUN 24* 27*  CREATININE 1.55* 1.30*  CALCIUM 9.0  --    GFR: Estimated Creatinine Clearance: 76.5 mL/min (by C-G formula based on SCr of 1.3 mg/dL (H)). Liver Function Tests:  Recent Labs Lab 01/17/16 1721  AST 18  ALT 15*  ALKPHOS 83  BILITOT 0.3  PROT 6.9  ALBUMIN 3.5   No results for input(s): LIPASE, AMYLASE in the last 168 hours. No results for input(s): AMMONIA in the last 168 hours. Coagulation Profile:  Recent Labs Lab 01/17/16 1721  INR 1.00   Cardiac Enzymes: No results for input(s): CKTOTAL, CKMB, CKMBINDEX, TROPONINI in the last 168 hours. BNP (last 3 results) No results for input(s): PROBNP in the last 8760 hours. HbA1C: No results for input(s): HGBA1C in the last 72 hours. CBG:  Recent Labs Lab 01/17/16 1730 01/17/16 2132  GLUCAP 391* 140*   Lipid Profile: No results for input(s): CHOL, HDL, LDLCALC, TRIG, CHOLHDL, LDLDIRECT in the last 72 hours. Thyroid Function Tests: No results for input(s): TSH, T4TOTAL, FREET4, T3FREE, THYROIDAB in the last 72 hours. Anemia Panel: No results for input(s): VITAMINB12, FOLATE, FERRITIN, TIBC, IRON, RETICCTPCT in the last 72 hours. Urine analysis:    Component Value Date/Time   COLORURINE YELLOW 02/16/2010 [place] 02/16/2010 1154   LABSPEC 1.026 02/16/2010 1154   PHURINE 6.5 02/16/2010 1154   GLUCOSEU >1000 (A) 02/16/2010 1154    HGBUR NEGATIVE 02/16/2010 1154   BILIRUBINUR NEGATIVE 02/16/2010 1154   KETONESUR 15 (A) 02/16/2010 1154   PROTEINUR NEGATIVE 02/16/2010 1154   UROBILINOGEN 0.2 02/16/2010 1154   NITRITE NEGATIVE 02/16/2010 1154   LEUKOCYTESUR NEGATIVE 02/16/2010 1154   Sepsis Labs: Invalid input(s): PROCALCITONIN, LACTICIDVEN No results found for this or any previous visit (from the past 240 hour(s)).   Radiological Exams on Admission: Ct Head Wo Contrast  Result Date: 01/17/2016 CLINICAL DATA:  Left facial droop and intermittent slurred speech for 1 week. EXAM: CT HEAD WITHOUT CONTRAST TECHNIQUE: Contiguous axial images were obtained from the base of the skull through the vertex without intravenous contrast. COMPARISON:  Head CT scan 02/16/2010 and brain MRI 02/18/2010. FINDINGS:  Brain: Multiple areas of remote infarct in the right MCA territory are identified as seen on the prior examination. No evidence of acute infarction, hemorrhage, mass lesion, mass effect, midline shift or abnormal extra-axial fluid collection. No hydrocephalus or pneumocephalus. Vascular: Extensive atherosclerotic vascular disease is seen. Skull: Intact. Sinuses/Orbits: Unremarkable. Other: None. IMPRESSION: No acute abnormality. Multiple areas of remote infarction in the right MCA territory. Atherosclerosis. Electronically Signed   By: Inge Rise M.D.   On: 01/17/2016 18:57   Mr Brain Wo Contrast  Result Date: 01/17/2016 CLINICAL DATA:  55 year old male with left facial droop for 1 week. Initial encounter. EXAM: MRI HEAD WITHOUT CONTRAST TECHNIQUE: Multiplanar, multiecho pulse sequences of the brain and surrounding structures were obtained without intravenous contrast. COMPARISON:  Head CT without contrast 1841 hours today. Brain MRI 02/18/2010. FINDINGS: Brain: Patchy and confluent areas of chronic encephalomalacia in the right hemisphere with superimposed scattered foci of restricted diffusion in the middle and posterior right  MCA territories. There is also an isodense diffusion abnormality with swelling and confluent T2 and FLAIR hyperintensity in the anterior right basal ganglia. There is associated petechial hemorrhage in the acute and subacute areas as well superimposed chronic hemosiderin. No intracranial mass effect. No left hemisphere or posterior fossa restricted diffusion. No ventriculomegaly. Negative pituitary and cervicomedullary junction. No extra-axial collection. Left basal ganglia, thalami, brainstem, and cerebellum remain normal. Minimal nonspecific white matter signal changes in the left hemisphere. Vascular: Major intracranial vascular flow voids are stable since 2011. Skull and upper cervical spine: Negative. Stable bone marrow signal since 2011. Sinuses/Orbits: Interval postoperative changes to the left globe. Otherwise negative. Mild paranasal sinus mucosal thickening and mild left mastoid effusion are stable. Other: Visible internal auditory structures appear normal. Negative scalp soft tissues. IMPRESSION: Acute and subacute on chronic right MCA territory infarcts. Petechial hemorrhage but no malignant hemorrhagic transformation or intracranial mass effect. Electronically Signed   By: Genevie Ann M.D.   On: 01/17/2016 21:38    EKG: Independently reviewed. sinus  Assessment/Plan Active Problems:   Essential hypertension   CVA (cerebral vascular accident) ([place])   DM (diabetes mellitus), secondary, uncontrolled, w/neurologic complic ([place])   Renal insufficiency   CVA MRI shows acute and subacute on chronic right MCA territory infarcts Patient has uncontrolled diabetes MRA ordered Echo/carotids ordered Fasting lipid panel and hemoglobin A1c ordered Neurology consult  Uncontrolled diabetes Patient does not have a working meter at home -last HgbA1C was 7 -resume home meds with SSI  Renal insuff -5 years ago Cr was normal, suspect with uncontrolled DM this is his new normal -gentle IVF  OSA -not  wearing CPAP   DVT prophylaxis: lovenox Code Status: full Family Communication: wife at bedside Disposition Plan: PT eval Consults called: neuro- Dr. Earnestine Leys Admission status: admit   [facility] Pager [phone]  If 7PM-7AM, please contact night-coverage [email or web address] Password [email or web address]  01/17/2016, 10:54 PM

## 2016-01-17 NOTE — ED Notes (Signed)
IV attempts x2. Unsuccessful.

## 2016-01-17 NOTE — ED Notes (Signed)
Patient transported to CT 

## 2016-01-17 NOTE — ED Notes (Signed)
140 mg/dL blood glucose

## 2016-01-18 ENCOUNTER — Encounter (HOSPITAL_COMMUNITY): Payer: Self-pay | Admitting: *Deleted

## 2016-01-18 ENCOUNTER — Encounter (HOSPITAL_COMMUNITY): Payer: Federal, State, Local not specified - PPO

## 2016-01-18 ENCOUNTER — Inpatient Hospital Stay (HOSPITAL_COMMUNITY): Payer: Federal, State, Local not specified - PPO

## 2016-01-18 DIAGNOSIS — Z91148 Patient's other noncompliance with medication regimen for other reason: Secondary | ICD-10-CM

## 2016-01-18 DIAGNOSIS — I63511 Cerebral infarction due to unspecified occlusion or stenosis of right middle cerebral artery: Principal | ICD-10-CM

## 2016-01-18 DIAGNOSIS — Z9114 Patient's other noncompliance with medication regimen: Secondary | ICD-10-CM

## 2016-01-18 DIAGNOSIS — I6789 Other cerebrovascular disease: Secondary | ICD-10-CM

## 2016-01-18 DIAGNOSIS — I6521 Occlusion and stenosis of right carotid artery: Secondary | ICD-10-CM

## 2016-01-18 DIAGNOSIS — I639 Cerebral infarction, unspecified: Secondary | ICD-10-CM

## 2016-01-18 DIAGNOSIS — I63411 Cerebral infarction due to embolism of right middle cerebral artery: Secondary | ICD-10-CM

## 2016-01-18 LAB — GLUCOSE, CAPILLARY
GLUCOSE-CAPILLARY: 287 mg/dL — AB (ref 65–99)
GLUCOSE-CAPILLARY: 251 mg/dL — AB (ref 65–99)
GLUCOSE-CAPILLARY: 48 mg/dL — AB (ref 65–99)
Glucose-Capillary: 118 mg/dL — ABNORMAL HIGH (ref 65–99)
Glucose-Capillary: 50 mg/dL — ABNORMAL LOW (ref 65–99)
Glucose-Capillary: 169 mg/dL — ABNORMAL HIGH (ref 65–99)

## 2016-01-18 LAB — RAPID URINE DRUG SCREEN, HOSP PERFORMED
AMPHETAMINES: NOT DETECTED
Barbiturates: NOT DETECTED
Benzodiazepines: NOT DETECTED
Cocaine: NOT DETECTED
OPIATES: NOT DETECTED
TETRAHYDROCANNABINOL: NOT DETECTED

## 2016-01-18 LAB — LIPID PANEL
Cholesterol: 115 mg/dL (ref 0–200)
HDL: 23 mg/dL — ABNORMAL LOW (ref >40)
LDL Cholesterol: 27 mg/dL (ref 0–99)
Total CHOL/HDL Ratio: 5 RATIO
Triglycerides: 325 mg/dL — ABNORMAL HIGH (ref <150)
VLDL: 65 mg/dL — ABNORMAL HIGH (ref 0–40)

## 2016-01-18 LAB — CBC
HCT: 34.3 % — ABNORMAL LOW (ref 39.0–52.0)
Hemoglobin: 11.7 g/dL — ABNORMAL LOW (ref 13.0–17.0)
MCH: 30.1 pg (ref 26.0–34.0)
MCHC: 34.1 g/dL (ref 30.0–36.0)
MCV: 88.2 fL (ref 78.0–100.0)
PLATELETS: 211 10*3/uL (ref 150–400)
RBC: 3.89 MIL/uL — ABNORMAL LOW (ref 4.22–5.81)
RDW: 11.7 % (ref 11.5–15.5)
WBC: 5.6 10*3/uL (ref 4.0–10.5)

## 2016-01-18 LAB — BASIC METABOLIC PANEL
Anion gap: 6 (ref 5–15)
BUN: 21 mg/dL — AB (ref 6–20)
CALCIUM: 8.3 mg/dL — AB (ref 8.9–10.3)
CO2: 26 mmol/L (ref 22–32)
CREATININE: 1.26 mg/dL — AB (ref 0.61–1.24)
Chloride: 107 mmol/L (ref 101–111)
GFR calc Af Amer: >60 mL/min (ref >60)
GLUCOSE: 258 mg/dL — AB (ref 65–99)
Potassium: 4 mmol/L (ref 3.5–5.1)
Sodium: 139 mmol/L (ref 135–145)

## 2016-01-18 LAB — ECHOCARDIOGRAM COMPLETE
HEIGHTINCHES: 70 in
WEIGHTICAEL: 3512 [oz_av]

## 2016-01-18 MED ORDER — CLOPIDOGREL BISULFATE 75 MG PO TABS
75.0000 mg | ORAL_TABLET | Freq: Every day | ORAL | Status: DC
  Administered 2016-01-18 – 2016-01-19 (×2): 75 mg via ORAL
  Filled 2016-01-18 (×2): qty 1

## 2016-01-18 MED ORDER — IOPAMIDOL (ISOVUE-370) INJECTION 76%
INTRAVENOUS | Status: AC
  Administered 2016-01-18: 50 mL
  Filled 2016-01-18: qty 50

## 2016-01-18 MED ORDER — INSULIN GLARGINE 100 UNIT/ML ~~LOC~~ SOLN
35.0000 [IU] | Freq: Every day | SUBCUTANEOUS | Status: DC
  Administered 2016-01-19: 35 [IU] via SUBCUTANEOUS
  Filled 2016-01-18: qty 0.35

## 2016-01-18 MED ORDER — ATORVASTATIN CALCIUM 80 MG PO TABS
80.0000 mg | ORAL_TABLET | Freq: Every day | ORAL | Status: DC
  Administered 2016-01-18: 80 mg via ORAL
  Filled 2016-01-18: qty 1

## 2016-01-18 MED ORDER — ASPIRIN EC 325 MG PO TBEC
325.0000 mg | DELAYED_RELEASE_TABLET | Freq: Every day | ORAL | Status: DC
  Administered 2016-01-18 – 2016-01-19 (×2): 325 mg via ORAL
  Filled 2016-01-18 (×2): qty 1

## 2016-01-18 NOTE — Progress Notes (Signed)
Hypoglycemic Event  CBG: 50  Treatment: 15 GM carbohydrate snack  Symptoms: None  Follow-up CBG: Time:1808 CBG Result:118  Possible Reasons for Event: Medication regimen   Comments/MD notified:Dr. Posey Pronto notified, medication regimen changed    Cory Carpenter, Donald Siva

## 2016-01-18 NOTE — Progress Notes (Signed)
[facility] Progress Note  Patient: Cory Carpenter [id number]   PCP: [facility] DOB: 28-May-1960   DOA: 01/17/2016   DOS: 01/18/2016   Date of Service: the patient was seen and examined on 01/18/2016  Brief hospital course: Pt. with PMH of Type II DM, HTN, CVA, OSA, CKD; admitted on 01/17/2016, with complaint of weakness, was found to have CVA. Currently further plan is completed stroke workup.  Assessment and Plan: 1. Right MCA acute and subacute infarct. Likely embolic in nature. Neurology consulted. Patient noncompliant with medical regimen at home, was not taking aspirin for last 1 year as well as not using C Pap regularly. Currently on aspirin and Plavix. Arterial 27. Hemoglobin A1c pending. 2-D echo pending. CTA neck pending. Physical therapy consulted. Passed swallowing evaluation.  2. Type 2 diabetes mellitus. Patient is hyperglycemic here in the hospital on home regimen. Likely noncompliant with regimen or diet at home. Currently we will discontinue pre-meal coverage and just continue home sliding scale as well as long-acting regimen. I would also reduce Lantus from 15 units to 35 units starting tomorrow.  3. HTN. Permissive hypertension at present. Continue to closely monitor.  4. Obstructive sleep apnea. Patient mentions that his full face mask is not comfortable. Next I recommend patient to discuss with PCP to get another sleep study with nasal pillows or nasal mask.  5. Chronic kidney disease. Continue to closely monitor.  Pain management: When necessary Tylenol Activity: Consulted physical therapy Bowel regimen: last BM prior to admission Diet: Cardiac and cor modified diet DVT Prophylaxis: subcutaneous Heparin  Advance goals of care discussion: Full code  Family Communication: no family was present at bedside, at the time of interview.   Disposition:  Discharge to home. Expected discharge date: 01/19/2016,   Consultants:  Neurology Procedures: Echocardiogram  Antibiotics: Anti-infectives    None        Subjective: Denies any acute complaint. No dizziness or lightheadedness. No acute events overnight.  Objective: Physical Exam: Vitals:   01/18/16 0730 01/18/16 1009 01/18/16 1450 01/18/16 1734  BP: (!) 158/76 (!) 143/76 138/72 (!) 181/62  Pulse: 72 (!) 59 62 70  Resp: 16 16 17 16   Temp: 98.6 F (37 C) 98.7 F (37.1 C) 98.6 F (37 C) 98.3 F (36.8 C)  TempSrc: Oral Oral Oral Oral  SpO2: 98% 98% 99% 98%  Weight:      Height:        Intake/Output Summary (Last 24 hours) at 01/18/16 1917 Last data filed at 01/18/16 0600  Gross per 24 hour  Intake           1747.5 ml  Output              600 ml  Net           1147.5 ml   Filed Weights   01/17/16 1717 01/17/16 2340  Weight: 102.1 kg (225 lb) 99.6 kg (219 lb 8 oz)    General: Alert, Awake and Oriented to Time, Place and Person. Appear in mild distress, affect appropriate Eyes: PERRL, Conjunctiva normal ENT: Oral Mucosa clear moist. Neck: no JVD, no Abnormal Mass Or lumps Cardiovascular: S1 and S2 Present, no Murmur, Respiratory: Bilateral Air entry equal and Decreased, no use of accessory muscle, Clear to Auscultation, no Crackles, no wheezes Abdomen: Bowel Sound present, Soft and no tenderness Skin: no redness, no Rash, no induration Extremities: no Pedal edema, no calf tenderness Neurologic: Left facial droop, slurred speech Data Reviewed: CBC:  Recent Labs  Lab 01/17/16 1721 01/17/16 1743 01/18/16 0535  WBC 4.6  --  5.6  NEUTROABS 2.2  --   --   HGB 12.9* 13.6 11.7*  HCT 37.7* 40.0 34.3*  MCV 87.9  --  88.2  PLT 233  --  123456   Basic Metabolic Panel:  Recent Labs Lab 01/17/16 1721 01/17/16 1743 01/18/16 0535  NA 134* 136 139  K 4.4 4.4 4.0  CL 98* 97* 107  CO2 28  --  26  GLUCOSE 476* 451* 258*  BUN 24* 27* 21*  CREATININE 1.55* 1.30* 1.26*  CALCIUM 9.0  --  8.3*    Liver Function Tests:  Recent Labs Lab  01/17/16 1721  AST 18  ALT 15*  ALKPHOS 83  BILITOT 0.3  PROT 6.9  ALBUMIN 3.5   No results for input(s): LIPASE, AMYLASE in the last 168 hours. No results for input(s): AMMONIA in the last 168 hours. Coagulation Profile:  Recent Labs Lab 01/17/16 1721  INR 1.00   Cardiac Enzymes: No results for input(s): CKTOTAL, CKMB, CKMBINDEX, TROPONINI in the last 168 hours. BNP (last 3 results) No results for input(s): PROBNP in the last 8760 hours.  CBG:  Recent Labs Lab 01/18/16 0626 01/18/16 1200 01/18/16 1737 01/18/16 1739 01/18/16 1808  GLUCAP 287* 251* 50* 48* 118*    Studies: Dg Chest 2 View  Result Date: 01/18/2016 CLINICAL DATA:  Left facial droop and lethargy for 1 week. History of diabetes, hypertension and sleep apnea. EXAM: CHEST  2 VIEW COMPARISON:  02/16/2010 and 11/07/2008. FINDINGS: The heart size and mediastinal contours are stable. The lungs are clear. There is no pleural effusion or pneumothorax. Left pleural thickening and old left-sided rib fractures are stable. No acute osseous findings are seen. IMPRESSION: Stable chest.  No acute cardiopulmonary process. Electronically Signed   By: Richardean Sale M.D.   On: 01/18/2016 16:49   Ct Angio Neck W Or Wo Contrast  Result Date: 01/18/2016 CLINICAL DATA:  55 year old male with left facial droop and slurred speech for about 1 week, presenting yesterday. Acute and subacute on chronic right MCA infarcts on MRI. Severe right M1 and moderate to severe right ICA terminus flow lesions on MRA. Initial encounter. EXAM: CT ANGIOGRAPHY NECK TECHNIQUE: Multidetector CT imaging of the neck was performed using the standard protocol during bolus administration of intravenous contrast. Multiplanar CT image reconstructions and MIPs were obtained to evaluate the vascular anatomy. Carotid stenosis measurements (when applicable) are obtained utilizing NASCET criteria, using the distal internal carotid diameter as the denominator.  CONTRAST:  50 mL Isovue 370 COMPARISON:  Intracranial MRA 0743 hours today. Brain MRI 01/17/2016. Intracranial MRA 02/18/2010. FINDINGS: Skeleton: Degenerative osseous changes. No acute osseous abnormality identified. Stable visible paranasal sinuses and mastoids. Upper chest: Mild perihilar atelectasis. No superior mediastinal lymphadenopathy. Other neck: Negative thyroid, parapharyngeal spaces, retropharyngeal space, sublingual space, submandibular glands and parotid glands. Motion artifact affecting the pharynx which appears grossly normal. Mild benign appearing asymmetry of the larynx (series 401, image 68). No cervical lymphadenopathy. Stable visualized brain parenchyma. Negative Visualized orbits and scalp soft tissues. Aortic arch: Extensive calcified left coronary artery atherosclerosis. Three vessel arch configuration. Minimal soft arch plaque. No great vessel origin stenosis. Right carotid system: No brachiocephalic or right CCA origin stenosis. Negative right CCA. Motion artifact at the right carotid bifurcation with no definite stenosis. There is evidence of soft plaque in the distal cervical right ICA just below the skullbase not resulting in hemodynamically significant stenosis (series 401, image 115)  and this corresponds to the MRA finding (#4 of that report) this morning. Left carotid system: Negative left CCA origin. Negative left CCA proximal to the bifurcation. Motion artifact at the left carotid bifurcation similar to that on the right and also without strong evidence of proximal left ICA atherosclerosis or stenosis. Negative cervical left ICA. Both visible ICA siphons remain patent with bilateral calcified atherosclerosis, and at the right ICA terminus concentric high-grade narrowing without calcified plaque (series 401, image 135) which corresponds to the MRA finding this morning (#1). Vertebral arteries:No proximal subclavian artery stenosis. Normal vertebral artery origins. Dominant left  vertebral artery without stenosis to the vertebrobasilar junction. Normal left PICA origin. Non dominant right vertebral artery without stenosis. Stable and negative visible basilar artery. IMPRESSION: 1. Unfortunate motion artifact in the neck involving both carotid bifurcations. However, there is no strong evidence of carotid bifurcation, proximal ICA, or vertebral artery atherosclerosis or stenosis. Re- evaluation of the carotid bifurcations with Doppler Ultrasound would be complementary in this setting. 2. There is soft plaque or thrombus at the distal cervical right ICA (no significant stenosis) and at the right ICA terminus (severe stenosis) corresponding to the right ICA findings on the Head MRA reported this morning. Electronically Signed   By: Genevie Ann M.D.   On: 01/18/2016 13:26   Mr Brain Wo Contrast  Result Date: 01/17/2016 CLINICAL DATA:  55 year old male with left facial droop for 1 week. Initial encounter. EXAM: MRI HEAD WITHOUT CONTRAST TECHNIQUE: Multiplanar, multiecho pulse sequences of the brain and surrounding structures were obtained without intravenous contrast. COMPARISON:  Head CT without contrast 1841 hours today. Brain MRI 02/18/2010. FINDINGS: Brain: Patchy and confluent areas of chronic encephalomalacia in the right hemisphere with superimposed scattered foci of restricted diffusion in the middle and posterior right MCA territories. There is also an isodense diffusion abnormality with swelling and confluent T2 and FLAIR hyperintensity in the anterior right basal ganglia. There is associated petechial hemorrhage in the acute and subacute areas as well superimposed chronic hemosiderin. No intracranial mass effect. No left hemisphere or posterior fossa restricted diffusion. No ventriculomegaly. Negative pituitary and cervicomedullary junction. No extra-axial collection. Left basal ganglia, thalami, brainstem, and cerebellum remain normal. Minimal nonspecific white matter signal changes  in the left hemisphere. Vascular: Major intracranial vascular flow voids are stable since 2011. Skull and upper cervical spine: Negative. Stable bone marrow signal since 2011. Sinuses/Orbits: Interval postoperative changes to the left globe. Otherwise negative. Mild paranasal sinus mucosal thickening and mild left mastoid effusion are stable. Other: Visible internal auditory structures appear normal. Negative scalp soft tissues. IMPRESSION: Acute and subacute on chronic right MCA territory infarcts. Petechial hemorrhage but no malignant hemorrhagic transformation or intracranial mass effect. Electronically Signed   By: Genevie Ann M.D.   On: 01/17/2016 21:38   [name] Head/brain [id number] Cm  Result Date: 01/18/2016 CLINICAL DATA:  CVA. EXAM: MRA HEAD WITHOUT CONTRAST TECHNIQUE: Angiographic images of the Circle of Willis were obtained using MRA technique without intravenous contrast. COMPARISON:  02/18/2010 FINDINGS: Right ICA is smaller than the left to a mild degree. At the skullbase there is focal aspherical morphology. No definite intramural hematoma seen in this region on conventional MRI yesterday. Moderate to advanced focal narrowing of the supraclinoid right ICA which is new from 2011. There is a tight stenosis with flow gap at the proximal right M1 segment that is new from prior. Similar appearance was seen at the right M1 bifurcation previously; this area is irregular and mildly narrowed  today. No signs of intramural hematoma/ dissection on conventional MRI. The downstream vessels are asymmetrically under filled/enhanced. Advanced left M2 branch stenosis, not seen previously. Mild left vertebral artery dominance. No right PICA seen. Tandem mild narrowings of the the right P2 segment which is chronic and likely atherosclerotic. No major branch occlusion. Negative for aneurysm.  No evidence of vascular malformation. IMPRESSION: 1. 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 Development since 2011 favors  thrombosis or embolus. Moderate to advanced right supraclinoid ICA narrowing is also new from 2011. 2. Irregularity and mild narrowing at the previously severe distal right M1 stenosis seen in 2011. 3. Left M2 and right P2 narrowings that are likely atheromatous. 4. Asymmetric smaller right ICA with mild narrowing at the skullbase, new from 2011. Suggest cervical ICA workup. Electronically Signed   By: Monte Fantasia M.D.   On: 01/18/2016 08:11     Scheduled Meds: . aspirin EC  325 mg Oral Daily  . atorvastatin  80 mg Oral q1800  . brimonidine  1 drop Left Eye TID  . cholecalciferol  1,000 Units Oral Daily  . clopidogrel  75 mg Oral Daily  . dorzolamide-timolol  1 drop Left Eye BID  . heparin  5,000 Units Subcutaneous Q8H  . insulin aspart  0-15 Units Subcutaneous TID WC  . insulin aspart  0-5 Units Subcutaneous QHS  . [START ON 01/19/2016] insulin glargine  35 Units Subcutaneous Daily   Continuous Infusions: . sodium chloride 75 mL/hr at 01/17/16 2330   PRN Meds: senna-docusate  Time spent: 30 minutes  Author: Berle Mull, MD [facility] Pager: [phone] 01/18/2016 7:17 PM  If 7PM-7AM, please contact night-coverage at [email or web address], password [facility]

## 2016-01-18 NOTE — Progress Notes (Signed)
STROKE TEAM PROGRESS NOTE   HISTORY OF PRESENT ILLNESS (per record) Cory Carpenter is an 55 y.o. male who presented on 11/19 for evaluation of left facial droop, intermittent slurred speech, slowed ambulation and drowsiness. His symptoms had been ongoing for one week prior to presentation (LKW 01/10/2016). He has a history of uncontrolled DM, HTN, sleep apnea (noncompliant with CPAP). Was taking ASA up until one month ago but quit because "I ran out". His glucometer has also been broken for several months and he has not been checking his blood sugars at home. PMHx also includes glaucoma, left eye blindness and narcolepsy.   MRI performed this admission revealed acute and subacute on chronic right MCA territory infarcts with petechial hemorrhage.   Patient was not administered IV t-PA secondary to delay in arrival. He was admitted for further evaluation and treatment.   SUBJECTIVE (INTERVAL HISTORY) No family at bedside. Pt lying in bed, mildly sleepy. Admitted that he is not compliant with medication and medical device. He has not been on ASA for a year and his CPAP face mask is not working well so that he did not use it.    OBJECTIVE Temp:  [97.8 F (36.6 C)-98.6 F (37 C)] 98.3 F (36.8 C) (11/20 0500) Pulse Rate:  [52-77] 54 (11/20 0330) Cardiac Rhythm: Sinus bradycardia (11/20 0330) Resp:  [12-27] 16 (11/20 0500) BP: (110-143)/(57-105) 117/63 (11/20 0500) SpO2:  [94 %-100 %] 98 % (11/20 0500) Weight:  [99.6 kg (219 lb 8 oz)-102.1 kg (225 lb)] 99.6 kg (219 lb 8 oz) (11/19 2340)  CBC:  Recent Labs Lab 01/17/16 1721 01/17/16 1743 01/18/16 0535  WBC 4.6  --  5.6  NEUTROABS 2.2  --   --   HGB 12.9* 13.6 11.7*  HCT 37.7* 40.0 34.3*  MCV 87.9  --  88.2  PLT 233  --  [id number]    Basic Metabolic Panel:  Recent Labs Lab 01/17/16 1721 01/17/16 1743 01/18/16 0535  NA 134* 136 139  K 4.4 4.4 4.0  CL 98* 97* 107  CO2 28  --  26  GLUCOSE 476* 451* 258*  BUN 24* 27* 21*  CREATININE  1.55* 1.30* 1.26*  CALCIUM 9.0  --  8.3*    Lipid Panel:    Component Value Date/Time   CHOL 115 01/18/2016 0535   TRIG 325 (H) 01/18/2016 0535   HDL 23 (L) 01/18/2016 0535   CHOLHDL 5.0 01/18/2016 0535   VLDL 65 (H) 01/18/2016 0535   LDLCALC 27 01/18/2016 0535   HgbA1c:  Lab Results  Component Value Date   HGBA1C (H) 02/16/2010    11.4 (NOTE)                                                                       According to the ADA Clinical Practice Recommendations for 2011, when HbA1c is used as a screening test:   >=6.5%   Diagnostic of Diabetes Mellitus           (if abnormal result  is confirmed)  5.7-6.4%   Increased risk of developing Diabetes Mellitus  References:Diagnosis and Classification of Diabetes Mellitus,Diabetes [id number] 1):S62-S69 and Standards of Medical Care in         Diabetes - 2011,Diabetes [id number]  (  Suppl 1):S11-S61.   Urine Drug Screen:    Component Value Date/Time   LABOPIA NONE DETECTED 02/17/2010 0559   COCAINSCRNUR NONE DETECTED 02/17/2010 0559   LABBENZ NONE DETECTED 02/17/2010 0559   AMPHETMU NONE DETECTED 02/17/2010 0559   THCU NONE DETECTED 02/17/2010 0559   LABBARB  02/17/2010 0559    NONE DETECTED        DRUG SCREEN FOR MEDICAL PURPOSES ONLY.  IF CONFIRMATION IS NEEDED FOR ANY PURPOSE, NOTIFY LAB WITHIN 5 DAYS.        LOWEST DETECTABLE LIMITS FOR URINE DRUG SCREEN Drug Class       Cutoff (ng/mL) Amphetamine      1000 Barbiturate      200 Benzodiazepine   [id number] Tricyclics       [id number] Opiates          300 Cocaine          300 THC              50      IMAGING I have personally reviewed the radiological images below and agree with the radiology interpretations.  Ct Head Wo Contrast 01/17/2016 No acute abnormality. Multiple areas of remote infarction in the right MCA territory. Atherosclerosis.   Mr Brain Wo Contrast 01/17/2016 Acute and subacute on chronic right MCA territory infarcts. Petechial hemorrhage but no  malignant hemorrhagic transformation or intracranial mass effect.   [name] Head/brain Wo Cm 01/18/2016 1. 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 Development since 2011 favors thrombosis or embolus. Moderate to advanced right supraclinoid ICA narrowing is also new from 2011. 2. Irregularity and mild narrowing at the previously severe distal right M1 stenosis seen in 2011. 3. Left M2 and right P2 narrowings that are likely atheromatous. 4. Asymmetric smaller right ICA with mild narrowing at the skullbase, new from 2011.   Ct Angio Neck W Or Wo Contrast 01/18/2016 IMPRESSION: 1. Unfortunate motion artifact in the neck involving both carotid bifurcations. However, there is no strong evidence of carotid bifurcation, proximal ICA, or vertebral artery atherosclerosis or stenosis. Re- evaluation of the carotid bifurcations with Doppler Ultrasound would be complementary in this setting. 2. There is soft plaque or thrombus at the distal cervical right ICA (no significant stenosis) and at the right ICA terminus (severe stenosis) corresponding to the right ICA findings on the Head MRA reported this morning.   TTE - Left ventricle: The cavity size was normal. Systolic function was   normal. The estimated ejection fraction was in the range of 60%   to 65%. Wall motion was normal; there were no regional wall   motion abnormalities. Left ventricular diastolic function   parameters were normal. - Left atrium: The atrium was mildly dilated. - Atrial septum: No defect or patent foramen ovale was identified.  CUS - pending   PHYSICAL EXAM  Temp:  [97.8 F (36.6 C)-98.7 F (37.1 C)] 98.3 F (36.8 C) (11/20 1734) Pulse Rate:  [54-72] 70 (11/20 1734) Resp:  [16-20] 16 (11/20 1734) BP: (113-181)/(57-76) 181/62 (11/20 1734) SpO2:  [97 %-99 %] 98 % (11/20 1734) Weight:  [219 lb 8 oz (99.6 kg)] 219 lb 8 oz (99.6 kg) (11/19 2340)  General - Well nourished, well developed, in no apparent distress,  mildly sleepy.  Ophthalmologic - Fundi not visualized due to noncooperation.  Cardiovascular - Regular rate and rhythm.  Mental Status -  Level of arousal and orientation to time, place, and person were intact. Language including expression, naming, repetition, comprehension  was assessed and found intact. Fund of Knowledge was assessed and was intact.  Cranial Nerves II - XII - II - Visual field intact OD, decreased visual acuity OS with only HW not FC. III, IV, VI - Extraocular movements intact. V - Facial sensation intact bilaterally. VII - left facial droop. VIII - Hearing & vestibular intact bilaterally. X - Palate elevates symmetrically. XI - Chin turning & shoulder shrug intact bilaterally. XII - Tongue protrusion intact.  Motor Strength - The patient's strength was normal in all extremities except left hand mild decreased dexterity and left arm pronator drift.  Bulk was normal and fasciculations were absent.   Motor Tone - Muscle tone was assessed at the neck and appendages and was normal.  Reflexes - The patient's reflexes were 1+ in all extremities and he had no pathological reflexes.  Sensory - Light touch, temperature/pinprick were assessed and were symmetrical.    Coordination - The patient had normal movements in the hands and feet with no ataxia or dysmetria.  Tremor was absent.  Gait and Station - deferred   ASSESSMENT/PLAN Mr. Kiwan Mulvaney is a 55 y.o. male with history of HTN, DM, OSA, glaucoma, L eye blindness and narcolepsy presenting with L facial droop, slurred speech, slowed ambulation and drowsiness. He did not receive IV t-PA due to delay in arrival.   Stroke:  right MCA acute and subacute infarcts with petechial hemorrhagic transformation, infarct embolic secondary to unknown source  Resultant  Left facial droop and left arm mild weakness  MRI   right MCA acute and subacute infarcts with petechial hemorrhagic transformation  MRA  Severe R M1  stenosis, mod to severe R cavernous ICA narrowing, left M2 severe stenosis  CTA neck right ICA proximal soft plaque but no significant stenosis  CUS pending  2D Echo  EF 60-65%  LDL 27  HgbA1c pending  Heparin 5000 units sq tid for VTE prophylaxis  Diet heart healthy/carb modified Room service appropriate? Yes; Fluid consistency: Thin  No antithrombotics PTA, now on aspirin 325 mg daily and clopidogrel 75 mg daily given large vessel disease. Continue DAPT for 3 months, and then plavix alone.   Patient counseled to be compliant with his antithrombotic medications  Ongoing aggressive stroke risk factor management  Therapy recommendations:  pending  Disposition:  pending   Severe intracranial stenosis  Right distal M1 severe stenosis in 2011, which improved on current imaging.  Right proximal M1 severe stenosis this time  Right cavernous ICA severe stenosis   Right proximal ICA stenosis  Left M2 high grade stenosis  Right P2 stenosis  Not compliant with medical treatment  Need maximize medical treatment this time  Consider endovascular treatment if continues to fail under maximized medical treatment  Hypertension  Stable Permissive hypertension (OK if < 220/120) but gradually normalize in 5-7 days Long-term BP goal normotensive  Hyperlipidemia  Home meds:  pravachol 10, resumed in hospital  LDL 27, goal < 70  Now on lipitor 80 for maximized medical treatment  Continue statin at discharge  Diabetes type II, uncontrolled  HgbA1c pending, goal < 7.0  Uncontrolled  CBG fluctuating  On lantus  SSI  Other Stroke Risk Factors  Former Cigarette smoker  ETOH use, advised to drink no more than 2 drink(s) a day  Obesity, Body mass index is 31.49 kg/m., recommend weight loss, diet and exercise as appropriate   Obstructive sleep apnea, noncompliant with CPAP at home  Other Active Problems  Glaucoma  L eye blindness  Narcolepsy  Renal  insufficiency, Cr 1.26  Hospital day # 1  Rosalin Hawking, MD PhD Stroke Neurology 01/18/2016 11:50 PM   To contact Stroke Continuity provider, please refer to [email or web address] After hours, contact General Neurology

## 2016-01-18 NOTE — Consult Note (Signed)
NEURO HOSPITALIST CONSULT NOTE   Requestig physician: Dr. Posey Pronto  Reason for Consult: Right MCA stroke.   History obtained from:   Patient and Chart    HPI:                                                                                                                                          Cory Carpenter is an 55 y.o. male who presented on 11/19 for evaluation of left facial droop, intermittent slurred speech, slowed ambulation and drowsiness. His symptoms had been ongoing for one week prior to presentation. He has a history of uncontrolled DM, HTN, sleep apnea (noncompliant with CPAP). Was taking ASA up until one month ago but quit because "I ran out". His glucometer has also been broken for several months and he has not been checking his blood sugars at home. PMHx also includes glaucoma, left eye blindness and narcolepsy.   MRI performed this admission revealed acute and subacute on chronic right MCA territory infarcts with petechial hemorrhage.    Past Medical History:  Diagnosis Date  . Diabetes mellitus without complication ([place])   . Glaucoma   . Hypertension   . Narcolepsy   . Sleep apnea   . Stroke [facility])     History reviewed. No pertinent surgical history.  History reviewed. No pertinent family history.   Social History:  reports that he has quit smoking. He has never used smokeless tobacco. He reports that he drinks alcohol. He reports that he does not use drugs.  No Known Allergies  MEDICATIONS:                                                                                                                      Current Facility-Administered Medications:  .  0.9 %  sodium chloride infusion, , Intravenous, Continuous, Geradine Girt, DO, Last Rate: 75 mL/hr at 01/17/16 2330 .  brimonidine (ALPHAGAN) 0.2 % ophthalmic solution 1 drop, 1 drop, Left Eye, TID, Jessica U Vann, DO .  cholecalciferol (VITAMIN D) tablet 1,000 Units, 1,000 Units, Oral, Daily,  Jessica U Vann, DO .  dorzolamide-timolol (COSOPT) 22.3-6.8 MG/ML ophthalmic solution 1 drop, 1 drop, Left Eye, BID, Jessica U Vann, DO .  heparin injection 5,000 Units, 5,000 Units, Subcutaneous, Q8H, Jessica U  Vann, DO, 5,000 Units at 01/18/16 [id number] .  insulin aspart (novoLOG) injection 0-15 Units, 0-15 Units, Subcutaneous, TID WC, Jessica U Vann, DO .  insulin aspart (novoLOG) injection 0-5 Units, 0-5 Units, Subcutaneous, QHS, Jessica U Vann, DO .  insulin aspart (novoLOG) injection 20 Units, 20 Units, Subcutaneous, TID AC, Jessica U Vann, DO .  insulin glargine (LANTUS) injection 50 Units, 50 Units, Subcutaneous, Daily, Jessica U Vann, DO .  lisinopril (PRINIVIL,ZESTRIL) tablet 40 mg, 40 mg, Oral, Daily, Jessica U Vann, DO .  pravastatin (PRAVACHOL) tablet 10 mg, 10 mg, Oral, QHS, Jessica U Vann, DO, 10 mg at 01/18/16 0052 .  senna-docusate (Senokot-S) tablet 1 tablet, 1 tablet, Oral, QHS PRN, Geradine Girt, DO   ROS:                                                                                                                                       History obtained from patient. Does not endorse headache or pain at time of neurological evaluation.    Blood pressure 117/63, pulse (!) 54, temperature 98.3 F (36.8 C), temperature source Oral, resp. rate 16, height 5\' 10"  (1.778 m), weight 99.6 kg (219 lb 8 oz), SpO2 98 %.   Neurologic Examination:                                                                                                      HEENT-  Normocephalic/atraumatic. Wears patch over left eye.  Lungs- No gross wheezing. Respirations unlabored.  Extremities- Warm and well-perfused.   Neurological Examination Mental Status: Alert, oriented, thought content appropriate.  Speech fluent without evidence of aphasia.  Naming and comprehension intact. Able to follow all commands without difficulty. Cranial Nerves: II:  Visual fields intact to confrontation OD; severe vision loss  OS. Right pupil reactivity normal.  III,IV, VI: ptosis not present, EOMI are full without nystagmus. Exotropia noted.  V,VII: smile symmetric, facial temperature sensation normal bilaterally VIII: hearing intact to conversation IX,X: No hoarseness XI: bilateral shoulder shrug equal XII: midline tongue extension Motor: Right : Upper extremity   5/5    Left:     Upper extremity   5/5 except 4+/5 deltoid  Lower extremity   5/5     Lower extremity   5/5 Subtle pronator drift LUE Sensory: Temperature and light touch intact in all 4 extremities without extinction Deep Tendon Reflexes: Trace lower extremity reflexes. 1+ bilateral upper extremities. Toes equivocal.  Cerebellar: Normal FNF bilaterally.  Gait: Deferred.  Lab Results: Basic Metabolic Panel:  Recent Labs Lab 01/17/16 1721 01/17/16 1743  NA 134* 136  K 4.4 4.4  CL 98* 97*  CO2 28  --   GLUCOSE 476* 451*  BUN 24* 27*  CREATININE 1.55* 1.30*  CALCIUM 9.0  --     Liver Function Tests:  Recent Labs Lab 01/17/16 1721  AST 18  ALT 15*  ALKPHOS 83  BILITOT 0.3  PROT 6.9  ALBUMIN 3.5   No results for input(s): LIPASE, AMYLASE in the last 168 hours. No results for input(s): AMMONIA in the last 168 hours.  CBC:  Recent Labs Lab 01/17/16 1721 01/17/16 1743 01/18/16 0535  WBC 4.6  --  5.6  NEUTROABS 2.2  --   --   HGB 12.9* 13.6 11.7*  HCT 37.7* 40.0 34.3*  MCV 87.9  --  88.2  PLT 233  --  211    Cardiac Enzymes: No results for input(s): CKTOTAL, CKMB, CKMBINDEX, TROPONINI in the last 168 hours.  Lipid Panel: No results for input(s): CHOL, TRIG, HDL, CHOLHDL, VLDL, LDLCALC in the last 168 hours.  CBG:  Recent Labs Lab 01/17/16 1730 01/17/16 2132  GLUCAP 391* 140*    Microbiology: Results for orders placed or performed during the hospital encounter of 02/16/10  Urine culture     Status: None   Collection Time: 02/16/10 11:54 AM  Result Value Ref Range Status   Specimen Description URINE,  RANDOM  Final   Special Requests NONE  Final   Culture  Setup Time [id number]  Final   Colony Count NO GROWTH  Final   Culture NO GROWTH  Final   Report Status 02/17/2010 FINAL  Final    Coagulation Studies:  Recent Labs  01/17/16 1721  LABPROT 13.2  INR 1.00    Imaging: Ct Head Wo Contrast  Result Date: 01/17/2016 CLINICAL DATA:  Left facial droop and intermittent slurred speech for 1 week. EXAM: CT HEAD WITHOUT CONTRAST TECHNIQUE: Contiguous axial images were obtained from the base of the skull through the vertex without intravenous contrast. COMPARISON:  Head CT scan 02/16/2010 and brain MRI 02/18/2010. FINDINGS: Brain: Multiple areas of remote infarct in the right MCA territory are identified as seen on the prior examination. No evidence of acute infarction, hemorrhage, mass lesion, mass effect, midline shift or abnormal extra-axial fluid collection. No hydrocephalus or pneumocephalus. Vascular: Extensive atherosclerotic vascular disease is seen. Skull: Intact. Sinuses/Orbits: Unremarkable. Other: None. IMPRESSION: No acute abnormality. Multiple areas of remote infarction in the right MCA territory. Atherosclerosis. Electronically Signed   By: Inge Rise M.D.   On: 01/17/2016 18:57   Mr Brain Wo Contrast  Result Date: 01/17/2016 CLINICAL DATA:  55 year old male with left facial droop for 1 week. Initial encounter. EXAM: MRI HEAD WITHOUT CONTRAST TECHNIQUE: Multiplanar, multiecho pulse sequences of the brain and surrounding structures were obtained without intravenous contrast. COMPARISON:  Head CT without contrast 1841 hours today. Brain MRI 02/18/2010. FINDINGS: Brain: Patchy and confluent areas of chronic encephalomalacia in the right hemisphere with superimposed scattered foci of restricted diffusion in the middle and posterior right MCA territories. There is also an isodense diffusion abnormality with swelling and confluent T2 and FLAIR hyperintensity in the anterior right  basal ganglia. There is associated petechial hemorrhage in the acute and subacute areas as well superimposed chronic hemosiderin. No intracranial mass effect. No left hemisphere or posterior fossa restricted diffusion. No ventriculomegaly. Negative pituitary and cervicomedullary junction. No extra-axial collection. Left basal ganglia, thalami, brainstem, and cerebellum  remain normal. Minimal nonspecific white matter signal changes in the left hemisphere. Vascular: Major intracranial vascular flow voids are stable since 2011. Skull and upper cervical spine: Negative. Stable bone marrow signal since 2011. Sinuses/Orbits: Interval postoperative changes to the left globe. Otherwise negative. Mild paranasal sinus mucosal thickening and mild left mastoid effusion are stable. Other: Visible internal auditory structures appear normal. Negative scalp soft tissues. IMPRESSION: Acute and subacute on chronic right MCA territory infarcts. Petechial hemorrhage but no malignant hemorrhagic transformation or intracranial mass effect. Electronically Signed   By: Genevie Ann M.D.   On: 01/17/2016 21:38    Assessment: 1. Acute and subacute on chronic right MCA territory infarcts with petechial hemorrhage.  2. History of narcolepsy and sleep apnea. Noncompliant with CPAP at home.  3. DM with moderate to severe hyperglycemia.   Recommendations: 1. MRA head. 2. Carotid ultrasound.  3. TTE.  4. PT/OT/Speech. 5. Start ASA 81 mg po qd. Benefits outweigh risks despite petechial hemorrhagic conversion of portions of the infarctions.  6. Switch pravastatin to atorvastatin 40 mg po qd. Obtain baseline CK level and LFTs.  7. BP control. Out of permissive HTN time window.  8. Hypercoagulable panel.  9. Outpatient Neurology follow up with [facility].    Electronically signed: Dr. Kerney Elbe 01/18/2016, 6:06 AM

## 2016-01-18 NOTE — Care Management Note (Signed)
Case Management Note  Patient Details  Name: Cory Carpenter MRN: [id number] Date of Birth: 11-15-1960  Subjective/Objective:   Pt admitted with CVA. He is from home with his wife.                  Action/Plan: No f/u per PT. Awaiting OT recs. CM following for d/c needs.   Expected Discharge Date:  01/19/16               Expected Discharge Plan:  Home/Self Care  In-House Referral:     Discharge planning Services     Post Acute Care Choice:    Choice offered to:     DME Arranged:    DME Agency:     HH Arranged:    HH Agency:     Status of Service:  In process, will continue to follow  If discussed at Long Length of Stay Meetings, dates discussed:    Additional Comments:  Pollie Friar, RN 01/18/2016, 2:20 PM

## 2016-01-18 NOTE — Evaluation (Signed)
Occupational Therapy Evaluation Patient Details Name: Cory Carpenter MRN: [id number] DOB: [birth year] Today's Date: 01/18/2016    History of Present Illness Cory Carpenter a 55 y.o.malewith medical history significant of uncontrolled diabetes, hypertension, sleep apnea (not wearing C Pap for last few months). About a week ago wife noticed the patient had a left facial droop and then was sleepy or than he normally was. Today wife states that patient had a episode of slurred speech on the phone. Patient admits that his blood glucose meter has been broken for the last several months and he has not been checking his blood sugars at home. He states that he's been checking his blood sugars on his meter at work and they have been reading as "high". MRI revealed acute and suacute on chronic R MCA territory infarcts.   Clinical Impression   Pt is moving well and able to perform ADL at a modified independent level. Pt noted to have a decreased appreciation of his new deficits and slow processing/response speed. Pt has memory deficits at baseline and gets upset if his routine is interrupted. Session limited by pt leaving for a test. Will follow to further assess cognition. Concerned about pt driving, as is his wife.     Follow Up Recommendations  Outpatient OT    Equipment Recommendations  None recommended by OT    Recommendations for Other Services       Precautions / Restrictions Restrictions Weight Bearing Restrictions: No      Mobility Bed Mobility               General bed mobility comments: pt in chair  Transfers Overall transfer level: Modified independent Equipment used: None             General transfer comment: walked from chair to bathroom to w/c for test    Balance                                            ADL Overall ADL's : Modified independent                                       General ADL Comments: Performed  basic ADL ad ADL transfers modified independently.     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   Perception     Praxis      Pertinent Vitals/Pain Pain Assessment: No/denies pain     Hand Dominance Right   Extremity/Trunk Assessment Upper Extremity Assessment Upper Extremity Assessment: LUE deficits/detail LUE Deficits / Details: pt with 4+/5 strength in shoulder, slightly slower with FF, pt reports his L hand is at baseline, had surgery on his wrist as a child LUE Coordination: decreased fine motor   Lower Extremity Assessment Lower Extremity Assessment: Overall WFL for tasks assessed   Cervical / Trunk Assessment Cervical / Trunk Assessment: Normal   Communication Communication Communication: No difficulties (slow response speed)   Cognition Arousal/Alertness: Awake/alert Behavior During Therapy: WFL for tasks assessed/performed Overall Cognitive Status: Impaired/Different from baseline Area of Impairment: Safety/judgement;Problem solving     Memory: Decreased short-term memory (baseline)   Safety/Judgement: Decreased awareness of deficits   Problem Solving: Slow processing;Decreased initiation;Requires verbal cues     General Comments       Exercises  Shoulder Instructions      Home Living Family/patient expects to be discharged to:: Private residence Living Arrangements: Spouse/significant other Available Help at Discharge: Family;Available 24 hours/day Type of Home: House Home Access: Stairs to enter [employer] of Steps: 3 Entrance Stairs-Rails: None Home Layout: One level     Bathroom Shower/Tub: Teacher, early years/pre: Standard     Home Equipment: None          Prior Functioning/Environment Level of Independence: Independent        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 Additional ADL Goal #2: Pt and wife will be aware of any deficit areas revealed with MoCA and compensatory strategies/ramifications for IADL.  OT Frequency: Min 2X/week   Barriers to D/C:            Co-evaluation              End of Session    Activity Tolerance: Patient tolerated treatment well Patient left:  (in w/c going to test)   Time: [id number] OT Time Calculation (min): 29 min Charges:  OT General Charges $OT Visit: 1 Procedure OT Evaluation $OT Eval Low Complexity: 1 Procedure OT Treatments $Self Care/Home Management : 8-22 mins G-Codes:    Malka So 01/18/2016, 2:53 PM  [phone]

## 2016-01-18 NOTE — Evaluation (Signed)
Physical Therapy Evaluation and Discharge Patient Details Name: Cory Carpenter MRN: [id number] DOB: August 27, 1960 Today's Date: 01/18/2016   History of Present Illness  Chalino Mulla a 55 y.o.malewith medical history significant of uncontrolled diabetes, hypertension, sleep apnea (not wearing C Pap for last few months). About a week ago wife noticed the patient had a left facial droop and then was sleepy or than he normally was. Today wife states that patient had a episode of slurred speech on the phone. Patient admits that his blood glucose meter has been broken for the last several months and he has not been checking his blood sugars at home. He states that he's been checking his blood sugars on his meter at work and they have been reading as "high". MRI revealed acute and suacute on chronic R MCA territory infarcts.  Clinical Impression  Pt functioning at baseline. Pt with L eye blindness at baseline and typically ambulates slow but that is typical PTA. Pt with minimal falls risk as noted by score off 22/24 on DGI. Pt with no further acute PT needs at this time. PT SIGNING OFF.    Follow Up Recommendations No PT follow up;Supervision - Intermittent    Equipment Recommendations  None recommended by PT    Recommendations for Other Services       Precautions / Restrictions Precautions Precautions: Other (comment) Precaution Comments: L eye blindness Restrictions Weight Bearing Restrictions: No      Mobility  Bed Mobility               General bed mobility comments: pt up in chair upon PT arrival. RN said pt transferred without assist  Transfers Overall transfer level: Needs assistance Equipment used: None Transfers: Sit to/from Stand Sit to Stand: Modified independent (Device/Increase time)         General transfer comment: pushed up from arm rests, no signs of instability  Ambulation/Gait Ambulation/Gait assistance: Modified independent (Device/Increase  time) Ambulation Distance (Feet): 250 Feet Assistive device: None Gait Pattern/deviations: WFL(Within Functional Limits) Gait velocity: slow, but slow PTA   General Gait Details: no episodes of LOB, slow but typical for patient PTA, pt reports "I get there when I get there"  Stairs Stairs: Yes Stairs assistance: Supervision Stair Management: No rails (to mimic home set up) Number of Stairs: 2 General stair comments: no episodes of LOB  Wheelchair Mobility    Modified Rankin (Stroke Patients Only) Modified Rankin (Stroke Patients Only) Pre-Morbid Rankin Score: No significant disability Modified Rankin: No significant disability     Balance Overall balance assessment: Modified Independent                               Standardized Balance Assessment Standardized Balance Assessment : Dynamic Gait Index   Dynamic Gait Index Level Surface: Normal Change in Gait Speed: Mild Impairment Gait with Horizontal Head Turns: Normal Gait with Vertical Head Turns: Normal Gait and Pivot Turn: Mild Impairment Step Over Obstacle: Normal Step Around Obstacles: Normal Steps: Normal Total Score: 22       Pertinent Vitals/Pain Pain Assessment: No/denies pain    Home Living Family/patient expects to be discharged to:: Private residence Living Arrangements: Spouse/significant other Available Help at Discharge: Family;Available 24 hours/day Type of Home: House Home Access: Stairs to enter Entrance Stairs-Rails: None Entrance Stairs-Number of Steps: 3 Home Layout: One level Home Equipment: None      Prior Function Level of Independence: Independent  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[employer]     Odem: Right    Extremity/Trunk Assessment   Upper Extremity Assessment: LUE deficits/detail       LUE Deficits / Details: pt with fine motor deficits but had PTA due to wrist surgery at 55 yo   Lower Extremity  Assessment: Overall WFL for tasks assessed      Cervical / Trunk Assessment: Normal  Communication   Communication: No difficulties  Cognition Arousal/Alertness: Awake/alert Behavior During Therapy: Flat affect Overall Cognitive Status: Within Functional Limits for tasks assessed                      General Comments      Exercises     Assessment/Plan    PT Assessment Patent does not need any further PT services  PT Problem List            PT Treatment Interventions      PT Goals (Current goals can be found in the Care Plan section)  Acute Rehab PT Goals Patient Stated Goal: home PT Goal Formulation: All assessment and education complete, DC therapy    Frequency     Barriers to discharge        Co-evaluation               End of Session   Activity Tolerance: Patient tolerated treatment well Patient left: in chair;with call bell/phone within reach Nurse Communication: Mobility status         Time: [id number] PT Time Calculation (min) (ACUTE ONLY): 32 min   Charges:   PT Evaluation $PT Eval Moderate Complexity: 1 Procedure PT Treatments $Gait Training: 8-22 mins   PT G CodesKoleen Distance Gary Gabrielsen 01/18/2016, 9:22 AM   Kittie Plater, PT, DPT Pager #: [phone] Office #: [phone]

## 2016-01-19 ENCOUNTER — Inpatient Hospital Stay (HOSPITAL_COMMUNITY): Payer: Federal, State, Local not specified - PPO

## 2016-01-19 LAB — GLUCOSE, CAPILLARY
Glucose-Capillary: 254 mg/dL — ABNORMAL HIGH (ref 65–99)
Glucose-Capillary: 257 mg/dL — ABNORMAL HIGH (ref 65–99)

## 2016-01-19 LAB — HEMOGLOBIN A1C
Hgb A1c MFr Bld: 14.2 % — ABNORMAL HIGH (ref 4.8–5.6)
MEAN PLASMA GLUCOSE: 361 mg/dL

## 2016-01-19 MED ORDER — CLOPIDOGREL BISULFATE 75 MG PO TABS: 75.0000 mg | ORAL_TABLET | Freq: Every day | ORAL | 0 refills | Status: DC

## 2016-01-19 MED ORDER — INSULIN ASPART 100 UNIT/ML ~~LOC~~ SOLN: 10.0000 [IU] | Freq: Three times a day (TID) | SUBCUTANEOUS | 11 refills | Status: AC

## 2016-01-19 MED ORDER — ATORVASTATIN CALCIUM 80 MG PO TABS: 80.0000 mg | ORAL_TABLET | Freq: Every day | ORAL | 0 refills | Status: DC

## 2016-01-19 MED ORDER — ASPIRIN 325 MG PO TBEC: 325.0000 mg | DELAYED_RELEASE_TABLET | Freq: Every day | ORAL | 0 refills | Status: DC

## 2016-01-19 NOTE — Care Management Note (Signed)
Case Management Note  Patient Details  Name: Cory Carpenter MRN: [id number] Date of Birth: Aug 11, 1960  Subjective/Objective:                    Action/Plan: Plan is for patient to discharge home with his wife. OT recommending outpatient therapy. Dr Posey Pronto in agreement. Pt has gone to the [facility] and would like to attend there again. Orders placed in EPIC and information on the AVS.   Expected Discharge Date:  01/19/16               Expected Discharge Plan:  Home/Self Care  In-House Referral:     Discharge planning Services  CM Consult  Post Acute Care Choice:    Choice offered to:     DME Arranged:    DME Agency:     HH Arranged:    [place] Agency:     Status of Service:  Completed, signed off  If discussed at [employer] of Stay Meetings, dates discussed:    Additional Comments:  Pollie Friar, RN 01/19/2016, 11:36 AM

## 2016-01-19 NOTE — Evaluation (Signed)
Speech Language Pathology Evaluation Patient Details Name: Cory Carpenter MRN: [id number] DOB: 09/05/1960 Today's Date: 01/19/2016 Time: [id number] SLP Time Calculation (min) (ACUTE ONLY): 40 min  Problem List:  Patient Active Problem List   Diagnosis Date Noted  . Noncompliance with medications   . Intracranial carotid stenosis, right   . CVA (cerebral vascular accident) ([place]) 01/17/2016  . DM (diabetes mellitus), secondary, uncontrolled, w/neurologic complic ([place]) 123456  . Renal insufficiency 01/17/2016  . Essential hypertension 10/22/2008  . PLEURAL EFFUSION 10/22/2008   Past Medical History:  Past Medical History:  Diagnosis Date  . Diabetes mellitus without complication ([place])   . Glaucoma   . Hypertension   . Narcolepsy   . Sleep apnea   . Stroke [facility])    Past Surgical History: History reviewed. No pertinent surgical history. HPI:  55 yo male adm to [facility] with slurred speech - pt found to have right mca cva.  Speech evaluation ordered.  Pt has prior memory deficits from CVA five years ago.     Assessment / Plan / Recommendation Clinical Impression  Pt presents with baseline memory deficits from prior CVA  - 0/5 random words recalled within 5 minutes, 5/5 with category/multiple choice cue.  MOCA score was 22/25 with deficits being in areas of memory only.  No dysarthria noted nor language deficits.  Mild delay in responses to SLP questions noted.   Reviewed compensation strategies for memory deficits using teach back and examples.  No SLP follow up as all education completed and family/pt reports pt at baseline.      SLP Assessment  Patient does not need any further Speech Lanaguage Pathology Services    Follow Up Recommendations  None    Frequency and Duration           SLP Evaluation Cognition  Overall Cognitive Status: Impaired/Different from baseline Arousal/Alertness: Awake/alert Orientation Level: Oriented X4 Attention: Sustained;Selective Sustained  Attention: Appears intact Selective Attention: Impaired Memory: Impaired Memory Impairment: Retrieval deficit (with category or multiple choice cue pt id'd or recalled 5/5 items, scoring 8/15 on MIS) Awareness: Appears intact Problem Solving: Appears intact (used cell phone due to vision deficits and unable to see clock) Safety/Judgment: Appears intact       Comprehension  Auditory Comprehension Overall Auditory Comprehension: Appears within functional limits for tasks assessed Yes/No Questions: Not tested Commands: Within Functional Limits Conversation: Complex Visual Recognition/Discrimination Discrimination: Not tested Reading Comprehension Reading Status: Within funtional limits (for tasks evaluated)    Expression Expression Primary Mode of Expression: Verbal Verbal Expression Overall Verbal Expression: Appears within functional limits for tasks assessed Initiation: No impairment Repetition: No impairment Naming: No impairment Pragmatics: No impairment Written Expression Dominant Hand: Right Written Expression: Within Functional Limits (for tasks assessed)   Oral / Motor  Oral Motor/Sensory Function Overall Oral Motor/Sensory Function: Mild impairment (left facial asymmetry) Motor Speech Overall Motor Speech: Appears within functional limits for tasks assessed Respiration: Within functional limits Phonation: Other (comment) Articulation: Within functional limitis Intelligibility: Not tested Motor Planning: Impaired Motor Speech Errors: Not applicable   GO                    Claudie Fisherman, [facility] SLP [phone]

## 2016-01-19 NOTE — Progress Notes (Signed)
Inpatient Diabetes Program Recommendations  AACE/ADA: New Consensus Statement on Inpatient Glycemic Control (2015)  Target Ranges:  Prepandial:   less than 140 mg/dL      Peak postprandial:   less than 180 mg/dL (1-2 hours)      Critically ill patients:  140 - 180 mg/dL   Lab Results  Component Value Date   GLUCAP 254 (H) 01/19/2016   HGBA1C 14.2 (H) 01/18/2016    Review of Glycemic Control  Results for CHAPEL, ZELLAR (MRN [id number]) as of 01/19/2016 08:27  Ref. Range 01/18/2016 17:37 01/18/2016 17:39 01/18/2016 18:08 01/18/2016 21:37 01/19/2016 06:17  Glucose-Capillary Latest Ref Range: 65 - 99 mg/dL 50 (L) 48 (L) 118 (H) 169 (H) 254 (H)   Diabetes history: Type 2 Outpatient Diabetes medications: Novolog 20 units tid, Lantus 50 units qday,  Metformin 1000mg  bid Current orders for Inpatient glycemic control: Lantus 35 units qday, Novolog 0-15 units tid, novolog 0-5 units qhs  Inpatient Diabetes Program Recommendations: Hypoglycemia noted.  Likely a result of correction Novolog and too much mealtime Novolog insulin. Consider adding Novolog mealtime insulin 5 units tid (hold if patient eats less than 50%), continue Novolog correction as ordered.    Gentry Fitz, RN, BA, MHA, CDE Diabetes Coordinator Inpatient Diabetes Program  [phone] (Team Pager) [phone] ([place]) 01/19/2016 8:32 AM

## 2016-01-19 NOTE — Progress Notes (Signed)
STROKE TEAM PROGRESS NOTE   SUBJECTIVE (INTERVAL HISTORY) Wife is at bedside. Pt is ready to be discharged. I again emphasized about medication compliance, CPAP compliance, back to work schedule and PCP follow up. Pt and wife expressed understanding.     OBJECTIVE Temp:  [98.3 F (36.8 C)-98.7 F (37.1 C)] 98.6 F (37 C) (11/21 1045) Pulse Rate:  [57-70] 65 (11/21 1130) Cardiac Rhythm: Normal sinus rhythm (11/21 0700) Resp:  [16-18] 16 (11/21 1130) BP: (138-181)/(62-75) 138/65 (11/21 1130) SpO2:  [96 %-99 %] 98 % (11/21 1130)  CBC:   Recent Labs Lab 01/17/16 1721 01/17/16 1743 01/18/16 0535  WBC 4.6  --  5.6  NEUTROABS 2.2  --   --   HGB 12.9* 13.6 11.7*  HCT 37.7* 40.0 34.3*  MCV 87.9  --  88.2  PLT 233  --  123456    Basic Metabolic Panel:   Recent Labs Lab 01/17/16 1721 01/17/16 1743 01/18/16 0535  NA 134* 136 139  K 4.4 4.4 4.0  CL 98* 97* 107  CO2 28  --  26  GLUCOSE 476* 451* 258*  BUN 24* 27* 21*  CREATININE 1.55* 1.30* 1.26*  CALCIUM 9.0  --  8.3*    Lipid Panel:     Component Value Date/Time   CHOL 115 01/18/2016 0535   TRIG 325 (H) 01/18/2016 0535   HDL 23 (L) 01/18/2016 0535   CHOLHDL 5.0 01/18/2016 0535   VLDL 65 (H) 01/18/2016 0535   LDLCALC 27 01/18/2016 0535   HgbA1c:  Lab Results  Component Value Date   HGBA1C 14.2 (H) 01/18/2016   Urine Drug Screen:     Component Value Date/Time   LABOPIA NONE DETECTED 01/18/2016 2135   COCAINSCRNUR NONE DETECTED 01/18/2016 2135   LABBENZ NONE DETECTED 01/18/2016 2135   AMPHETMU NONE DETECTED 01/18/2016 2135   THCU NONE DETECTED 01/18/2016 2135   LABBARB NONE DETECTED 01/18/2016 2135      IMAGING I have personally reviewed the radiological images below and agree with the radiology interpretations.  Ct Head Wo Contrast 01/17/2016 No acute abnormality. Multiple areas of remote infarction in the right MCA territory. Atherosclerosis.   Mr Brain Wo Contrast 01/17/2016 Acute and subacute on  chronic right MCA territory infarcts. Petechial hemorrhage but no malignant hemorrhagic transformation or intracranial mass effect.   [name] Head/brain Wo Cm 01/18/2016 1. 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 Development since 2011 favors thrombosis or embolus. Moderate to advanced right supraclinoid ICA narrowing is also new from 2011. 2. Irregularity and mild narrowing at the previously severe distal right M1 stenosis seen in 2011. 3. Left M2 and right P2 narrowings that are likely atheromatous. 4. Asymmetric smaller right ICA with mild narrowing at the skullbase, new from 2011.   Ct Angio Neck W Or Wo Contrast 01/18/2016 IMPRESSION: 1. Unfortunate motion artifact in the neck involving both carotid bifurcations. However, there is no strong evidence of carotid bifurcation, proximal ICA, or vertebral artery atherosclerosis or stenosis. Re- evaluation of the carotid bifurcations with Doppler Ultrasound would be complementary in this setting. 2. There is soft plaque or thrombus at the distal cervical right ICA (no significant stenosis) and at the right ICA terminus (severe stenosis) corresponding to the right ICA findings on the Head MRA reported this morning.   TTE - Left ventricle: The cavity size was normal. Systolic function was   normal. The estimated ejection fraction was in the range of 60%   to 65%. Wall motion was normal;  there were no regional wall   motion abnormalities. Left ventricular diastolic function   parameters were normal. - Left atrium: The atrium was mildly dilated. - Atrial septum: No defect or patent foramen ovale was identified.   PHYSICAL EXAM  Temp:  [98.3 F (36.8 C)-98.7 F (37.1 C)] 98.6 F (37 C) (11/21 1045) Pulse Rate:  [57-70] 65 (11/21 1130) Resp:  [16-18] 16 (11/21 1130) BP: (138-181)/(62-75) 138/65 (11/21 1130) SpO2:  [96 %-99 %] 98 % (11/21 1130)  General - Well nourished, well developed, in no apparent distress, mildly  sleepy.  Ophthalmologic - Fundi not visualized due to noncooperation.  Cardiovascular - Regular rate and rhythm.  Mental Status -  Level of arousal and orientation to time, place, and person were intact. Language including expression, naming, repetition, comprehension was assessed and found intact. Fund of Knowledge was assessed and was intact.  Cranial Nerves II - XII - II - Visual field intact OD, decreased visual acuity OS with only HW not FC. III, IV, VI - Extraocular movements intact. V - Facial sensation intact bilaterally. VII - left facial droop. VIII - Hearing & vestibular intact bilaterally. X - Palate elevates symmetrically. XI - Chin turning & shoulder shrug intact bilaterally. XII - Tongue protrusion intact.  Motor Strength - The patient's strength was normal in all extremities except left hand mild decreased dexterity and left arm pronator drift.  Bulk was normal and fasciculations were absent.   Motor Tone - Muscle tone was assessed at the neck and appendages and was normal.  Reflexes - The patient's reflexes were 1+ in all extremities and he had no pathological reflexes.  Sensory - Light touch, temperature/pinprick were assessed and were symmetrical.    Coordination - The patient had normal movements in the hands and feet with no ataxia or dysmetria.  Tremor was absent.  Gait and Station - deferred   ASSESSMENT/PLAN Cory Carpenter is a 55 y.o. male with history of HTN, DM, OSA, glaucoma, L eye blindness and narcolepsy presenting with L facial droop, slurred speech, slowed ambulation and drowsiness. He did not receive IV t-PA due to delay in arrival.   Stroke:  right MCA acute and subacute infarcts with petechial hemorrhagic transformation, infarct embolic secondary to unknown source  Resultant  Left facial droop and left arm mild weakness  MRI   right MCA acute and subacute infarcts with petechial hemorrhagic transformation  MRA  Severe R M1 stenosis, mod  to severe R cavernous ICA narrowing, left M2 severe stenosis  CTA neck right ICA proximal soft plaque but no significant stenosis  2D Echo  EF 60-65%  LDL 27  HgbA1c 14.2  Heparin 5000 units sq tid for VTE prophylaxis Diet heart healthy/carb modified Room service appropriate? Yes; Fluid consistency: Thin Diet - low sodium heart healthy Diet Carb Modified  No antithrombotics PTA, now on aspirin 325 mg daily and clopidogrel 75 mg daily given large vessel disease. Continue DAPT for 3 months, and then plavix alone.   Patient counseled to be compliant with his antithrombotic medications  Ongoing aggressive stroke risk factor management  Therapy recommendations:  Outpt PT  Disposition:  home   Severe intracranial stenosis  Right distal M1 severe stenosis in 2011, which improved on current imaging.  Right proximal M1 severe stenosis this time  Right cavernous ICA severe stenosis   Right proximal ICA stenosis  Left M2 high grade stenosis  Right P2 stenosis  Not compliant with medical treatment  Need maximize medical treatment this  time  Consider endovascular treatment if continues to fail under maximized medical treatment  Diabetes type II, uncontrolled  HgbA1c 14.2, goal < 7.0  Uncontrolled  CBG fluctuating  On lantus  SSI Close PCP follow up  Hypertension  Stable Permissive hypertension (OK if < 220/120) but gradually normalize in 5-7 days Long-term BP goal normotensive  Hyperlipidemia  Home meds:  pravachol 10, resumed in hospital  LDL 27, goal < 70  Now on lipitor 80 for maximized medical treatment  Continue statin at discharge  Other Stroke Risk Factors  Former Cigarette smoker  ETOH use, advised to drink no more than 2 drink(s) a day  Obesity, Body mass index is 31.49 kg/m., recommend weight loss, diet and exercise as appropriate   Obstructive sleep apnea, noncompliant with CPAP at home  Other Active Problems  Glaucoma  L eye  blindness  Narcolepsy  Renal insufficiency  Hospital day # 2  Neurology will sign off. Please call with questions. Pt will follow up with Dr. Erlinda Hong at [facility] in about 6 weeks. Thanks for the consult.  Rosalin Hawking, MD PhD Stroke Neurology 01/19/2016 4:11 PM   To contact Stroke Continuity provider, please refer to [email or web address] After hours, contact General Neurology

## 2016-01-19 NOTE — Progress Notes (Addendum)
RN discussed discharge instructions with patient including follow up appointment with PCP in 1 week, patient's wife says appt is scheduled for Friday, f/u with neurology, and o/p therapy. Patient verbalized understanding of indication, compliance with asa, plavix, bp meds, new insulin dosage. Understands s/sx of stroke. Neuro assessment unchanged. IV removed, tele removed. Per Dr. Erlinda Hong, patient needs to be off work x2 weeks. Dr. Erlinda Hong, neuro is coming to see patient prior to discharge. Patient aware.

## 2016-01-19 NOTE — Progress Notes (Signed)
Occupational Therapy Treatment Patient Details Name: Cory Carpenter MRN: [id number] DOB: 1960/11/13 Today's Date: 01/19/2016    History of present illness Cory Carpenter a 55 y.o.malewith medical history significant of uncontrolled diabetes, hypertension, sleep apnea (not wearing C Pap for last few months). About a week ago wife noticed the patient had a left facial droop and then was sleepy or than he normally was. Today wife states that patient had a episode of slurred speech on the phone. Patient admits that his blood glucose meter has been broken for the last several months and he has not been checking his blood sugars at home. He states that he's been checking his blood sugars on his meter at work and they have been reading as "high". MRI revealed acute and suacute on chronic R MCA territory infarcts.   OT comments  Focus of session on further educating pt and wife in how deficits in processing speed and reaction time may impede pt's safety in high level IADL including work activities and driving. Instructed in compensatory strategies for memory deficits.   Follow Up Recommendations  Outpatient OT (specifically for pre driving screening, simulating work activities)    Equipment Recommendations  None recommended by OT    Recommendations for Other Services      Precautions / Restrictions Restrictions Weight Bearing Restrictions: No       Mobility Bed Mobility Overal bed mobility: Independent                Transfers Overall transfer level: Independent Equipment used: None                  Balance                                   ADL                                         General ADL Comments: Educated pt and wife in compensatory strategies for his memory.      Vision                     Perception     Praxis      Cognition   Behavior During Therapy: WFL for tasks assessed/performed Overall Cognitive  Status: Impaired/Different from baseline Area of Impairment: Safety/judgement;Problem solving;Memory     Memory: Decreased short-term memory    Safety/Judgement: Decreased awareness of deficits   Problem Solving: Slow processing;Decreased initiation;Requires verbal cues General Comments: pt commenting that he wanted his wife to take him to the shooting range    Extremity/Trunk Assessment               Exercises     Shoulder Instructions       General Comments      Pertinent Vitals/ Pain       Pain Assessment: No/denies pain  Home Living                                      Lives With: Spouse    Prior Functioning/Environment              Frequency  Min 2X/week        Progress Toward Goals  OT Goals(current goals  can now be found in the care plan section)  Progress towards OT goals: Goals met/education completed, patient discharged from OT  Acute Rehab OT Goals Patient Stated Goal: home Time For Goal Achievement: 01/25/16 Potential to Achieve Goals: Good  Plan Discharge plan remains appropriate    Co-evaluation                 End of Session     Activity Tolerance Patient tolerated treatment well   Patient Left in bed;with call bell/phone within reach;with family/visitor present   Nurse Communication          Time: 1130-1145 OT Time Calculation (min): 15 min  Charges: OT General Charges $OT Visit: 1 Procedure OT Treatments $Cognitive Skills Development: 8-22 mins  Malka So 01/19/2016, 11:59 AM  [phone]

## 2016-01-26 NOTE — Discharge Summary (Signed)
[facility] Discharge Summary   Patient: Cory Carpenter [id number]   PCP: [facility] DOB: November 04, 1960   Date of admission: 01/17/2016   Date of discharge: 01/19/2016     Discharge Diagnoses:  Active Problems:   Essential hypertension   CVA (cerebral vascular accident) ([place])   DM (diabetes mellitus), secondary, uncontrolled, w/neurologic complic ([place])   Renal insufficiency   Noncompliance with medications   Intracranial carotid stenosis, right   Admitted From: home Disposition:  home  Recommendations for Outpatient Follow-up:  1. Please follow up with PCP in 1 week   [facility] up.   Specialty:  Rehabilitation Why:  They will contact you for the first appointment.  Contact information: [address] [place] [id number] [place] [phone]       [facility] Schedule an appointment as soon as possible for a visit in 1 week(s).   Contact information: [place] [address] [phone]        Xu,Jindong, MD. Schedule an appointment as soon as possible for a visit in 6 week(s).   Specialty:  Neurology Contact information: [address] [place] [address] [phone]          Diet recommendation: cardiac diet  Activity: The patient is advised to gradually reintroduce usual activities.  Discharge Condition: good  Code Status: full code  History of present illness: As per the H and P dictated on admission, "Cory Carpenter is a 55 y.o. male with medical history significant of uncontrolled diabetes, hypertension, sleep apnea (not wearing C Pap for last few months).  About a week ago wife noticed the patient had a left facial droop and then was sleepy or than he normally was. Today wife states that patient had a episode of slurred speech on the phone. Patient admits that his blood  glucose meter has been broken for the last several months and he has not been checking his blood sugars at home. He states that he's been checking his blood sugars on his meter at work and they have been reading as "high".  "  Hospital Course:   Summary of his active problems in the hospital is as following. 1. Right MCA acute and subacute infarct. Likely embolic in nature. Neurology consulted. Patient noncompliant with medical regimen at home, was not taking aspirin for last 1 year as well as not using C Pap regularly. Currently on aspirin and Plavix. LDL 27. Hemoglobin A1c pending. CTA neck 2-D echo unremarkable. Physical therapy consulted. Passed swallowing evaluation.  2. Type 2 diabetes mellitus. Patient is hyperglycemic here in the hospital on home regimen. Likely noncompliant with regimen or diet at home. Continue home regimen  3. HTN. Permissive hypertension at present. Continue to closely monitor.  4. Obstructive sleep apnea. Patient mentions that his full face mask is not comfortable.  I recommend patient to discuss with PCP to get another sleep study with nasal pillows or nasal mask.  5. Chronic kidney disease. Continue to closely monitor.  All other chronic medical condition were stable during the hospitalization.  Patient was seen by physical therapy, who recommended outpt therapy, which was arranged by Education officer, museum and case Freight forwarder. On the day of the discharge the patient's vitals were stable, and no other acute medical condition were reported by patient. the patient was felt safe to be discharge at home with therapy.  Procedures and Results:  Echocardiogram  Study Conclusions  -  Left ventricle: The cavity size was normal. Systolic function was   normal. The estimated ejection fraction was in the range of 60%   to 65%. Wall motion was normal; there were no regional wall   motion abnormalities. Left ventricular diastolic function   parameters were  normal. - Left atrium: The atrium was mildly dilated. - Atrial septum: No defect or patent foramen ovale was identified.   Consultations:  neurolgy  DISCHARGE MEDICATION: Discharge Medication List as of 01/19/2016  1:51 PM    START taking these medications   Details  aspirin EC 325 MG EC tablet Take 1 tablet (325 mg total) by mouth daily., Starting Wed 01/20/2016, Print    atorvastatin (LIPITOR) 80 MG tablet Take 1 tablet (80 mg total) by mouth daily at 6 PM., Starting Tue 01/19/2016, Print    clopidogrel (PLAVIX) 75 MG tablet Take 1 tablet (75 mg total) by mouth daily., Starting Wed 01/20/2016, Print      CONTINUE these medications which have CHANGED   Details  insulin aspart (NOVOLOG) 100 UNIT/ML injection Inject 10 Units into the skin 3 (three) times daily before meals., Starting Tue 01/19/2016, No Print      CONTINUE these medications which have NOT CHANGED   Details  brimonidine (ALPHAGAN) 0.2 % ophthalmic solution Place 1 drop into the left eye 3 (three) times daily., Historical Med    cholecalciferol (VITAMIN D) 1000 units tablet Take 1,000 Units by mouth daily., Historical Med    dorzolamide-timolol (COSOPT) 22.3-6.8 MG/ML ophthalmic solution Place 1 drop into the left eye 2 (two) times daily., Historical Med    insulin glargine (LANTUS) 100 UNIT/ML injection Inject 50 Units into the skin daily., Historical Med    lisinopril (PRINIVIL,ZESTRIL) 40 MG tablet Take 40 mg by mouth daily., Historical Med    metFORMIN (GLUCOPHAGE-XR) 500 MG 24 hr tablet Take 1,000 mg by mouth 2 (two) times daily., Historical Med      STOP taking these medications     pravastatin (PRAVACHOL) 10 MG tablet        No Known Allergies Discharge Instructions    Ambulatory referral to Neurology    Complete by:  As directed    Pt will follow up with Dr. Erlinda Hong at [facility] in about 2 months. Thanks.   Ambulatory referral to Occupational Therapy    Complete by:  As directed    Pre driving  assessment Decreased response time-----Theses found by OT in the hospital  Decreased awareness   Diet - low sodium heart healthy    Complete by:  As directed    Diet Carb Modified    Complete by:  As directed    Discharge instructions    Complete by:  As directed    It is important that you read following instructions as well as go over your medication list with RN to help you understand your care after this hospitalization.  Discharge Instructions: Please follow-up with PCP in one week  Please request your primary care physician to go over all Hospital Tests and Procedure/Radiological results at the follow up,  Please get all Hospital records sent to your PCP by signing hospital release before you go home.   Do not drive, operating heavy machinery, perform activities at heights, swimming or participation in water activities or provide baby sitting services ; until you have been seen by Primary Care Physician or a Neurologist and advised to do so again. Do not take more than prescribed Pain, Sleep and Anxiety Medications. You were  cared for by a hospitalist during your hospital stay. If you have any questions about your discharge medications or the care you received while you were in the hospital after you are discharged, you can call the unit and ask to speak with the hospitalist on call if the hospitalist that took care of you is not available.  Once you are discharged, your primary care physician will handle any further medical issues. Please note that NO REFILLS for any discharge medications will be authorized once you are discharged, as it is imperative that you return to your primary care physician (or establish a relationship with a primary care physician if you do not have one) for your aftercare needs so that they can reassess your need for medications and monitor your lab values. You Must read complete instructions/literature along with all the possible adverse reactions/side effects  for all the Medicines you take and that have been prescribed to you. Take any new Medicines after you have completely understood and accept all the possible adverse reactions/side effects. Wear Seat belts while driving. If you have smoked or chewed Tobacco in the last 2 yrs please stop smoking and/or stop any Recreational drug use.   Increase activity slowly    Complete by:  As directed      Discharge Exam: Filed Weights   01/17/16 1717 01/17/16 2340  Weight: 102.1 kg (225 lb) 99.6 kg (219 lb 8 oz)   Vitals:   01/19/16 1045 01/19/16 1130  BP: (!) 147/75 138/65  Pulse: 60 65  Resp: 17 16  Temp: 98.6 F (37 C)    General: Appear in no distress, no Rash; Oral Mucosa moist. Cardiovascular: S1 and S2 Present, no Murmur, no JVD Respiratory: Bilateral Air entry present and Clear to Auscultation, no Crackles, no wheezes Abdomen: Bowel Sound present, Soft and no tenderness Extremities: no Pedal edema, no calf tenderness Neurology: Grossly no focal neuro deficit.  The results of significant diagnostics from this hospitalization (including imaging, microbiology, ancillary and laboratory) are listed below for reference.    Significant Diagnostic Studies: Dg Chest 2 View  Result Date: 01/18/2016 CLINICAL DATA:  Left facial droop and lethargy for 1 week. History of diabetes, hypertension and sleep apnea. EXAM: CHEST  2 VIEW COMPARISON:  02/16/2010 and 11/07/2008. FINDINGS: The heart size and mediastinal contours are stable. The lungs are clear. There is no pleural effusion or pneumothorax. Left pleural thickening and old left-sided rib fractures are stable. No acute osseous findings are seen. IMPRESSION: Stable chest.  No acute cardiopulmonary process. Electronically Signed   By: Richardean Sale M.D.   On: 01/18/2016 16:49   Ct Head Wo Contrast  Result Date: 01/17/2016 CLINICAL DATA:  Left facial droop and intermittent slurred speech for 1 week. EXAM: CT HEAD WITHOUT CONTRAST TECHNIQUE:  Contiguous axial images were obtained from the base of the skull through the vertex without intravenous contrast. COMPARISON:  Head CT scan 02/16/2010 and brain MRI 02/18/2010. FINDINGS: Brain: Multiple areas of remote infarct in the right MCA territory are identified as seen on the prior examination. No evidence of acute infarction, hemorrhage, mass lesion, mass effect, midline shift or abnormal extra-axial fluid collection. No hydrocephalus or pneumocephalus. Vascular: Extensive atherosclerotic vascular disease is seen. Skull: Intact. Sinuses/Orbits: Unremarkable. Other: None. IMPRESSION: No acute abnormality. Multiple areas of remote infarction in the right MCA territory. Atherosclerosis. Electronically Signed   By: Inge Rise M.D.   On: 01/17/2016 18:57   Ct Angio Neck W Or Wo Contrast  Result Date:  01/18/2016 CLINICAL DATA:  55 year old male with left facial droop and slurred speech for about 1 week, presenting yesterday. Acute and subacute on chronic right MCA infarcts on MRI. Severe right M1 and moderate to severe right ICA terminus flow lesions on MRA. Initial encounter. EXAM: CT ANGIOGRAPHY NECK TECHNIQUE: Multidetector CT imaging of the neck was performed using the standard protocol during bolus administration of intravenous contrast. Multiplanar CT image reconstructions and MIPs were obtained to evaluate the vascular anatomy. Carotid stenosis measurements (when applicable) are obtained utilizing NASCET criteria, using the distal internal carotid diameter as the denominator. CONTRAST:  50 mL Isovue 370 COMPARISON:  Intracranial MRA 0743 hours today. Brain MRI 01/17/2016. Intracranial MRA 02/18/2010. FINDINGS: Skeleton: Degenerative osseous changes. No acute osseous abnormality identified. Stable visible paranasal sinuses and mastoids. Upper chest: Mild perihilar atelectasis. No superior mediastinal lymphadenopathy. Other neck: Negative thyroid, parapharyngeal spaces, retropharyngeal space,  sublingual space, submandibular glands and parotid glands. Motion artifact affecting the pharynx which appears grossly normal. Mild benign appearing asymmetry of the larynx (series 401, image 68). No cervical lymphadenopathy. Stable visualized brain parenchyma. Negative Visualized orbits and scalp soft tissues. Aortic arch: Extensive calcified left coronary artery atherosclerosis. Three vessel arch configuration. Minimal soft arch plaque. No great vessel origin stenosis. Right carotid system: No brachiocephalic or right CCA origin stenosis. Negative right CCA. Motion artifact at the right carotid bifurcation with no definite stenosis. There is evidence of soft plaque in the distal cervical right ICA just below the skullbase not resulting in hemodynamically significant stenosis (series 401, image 115) and this corresponds to the MRA finding (#4 of that report) this morning. Left carotid system: Negative left CCA origin. Negative left CCA proximal to the bifurcation. Motion artifact at the left carotid bifurcation similar to that on the right and also without strong evidence of proximal left ICA atherosclerosis or stenosis. Negative cervical left ICA. Both visible ICA siphons remain patent with bilateral calcified atherosclerosis, and at the right ICA terminus concentric high-grade narrowing without calcified plaque (series 401, image 135) which corresponds to the MRA finding this morning (#1). Vertebral arteries:No proximal subclavian artery stenosis. Normal vertebral artery origins. Dominant left vertebral artery without stenosis to the vertebrobasilar junction. Normal left PICA origin. Non dominant right vertebral artery without stenosis. Stable and negative visible basilar artery. IMPRESSION: 1. Unfortunate motion artifact in the neck involving both carotid bifurcations. However, there is no strong evidence of carotid bifurcation, proximal ICA, or vertebral artery atherosclerosis or stenosis. Re- evaluation of the  carotid bifurcations with Doppler Ultrasound would be complementary in this setting. 2. There is soft plaque or thrombus at the distal cervical right ICA (no significant stenosis) and at the right ICA terminus (severe stenosis) corresponding to the right ICA findings on the Head MRA reported this morning. Electronically Signed   By: Genevie Ann M.D.   On: 01/18/2016 13:26   Mr Brain Wo Contrast  Result Date: 01/17/2016 CLINICAL DATA:  55 year old male with left facial droop for 1 week. Initial encounter. EXAM: MRI HEAD WITHOUT CONTRAST TECHNIQUE: Multiplanar, multiecho pulse sequences of the brain and surrounding structures were obtained without intravenous contrast. COMPARISON:  Head CT without contrast 1841 hours today. Brain MRI 02/18/2010. FINDINGS: Brain: Patchy and confluent areas of chronic encephalomalacia in the right hemisphere with superimposed scattered foci of restricted diffusion in the middle and posterior right MCA territories. There is also an isodense diffusion abnormality with swelling and confluent T2 and FLAIR hyperintensity in the anterior right basal ganglia. There is associated petechial hemorrhage  in the acute and subacute areas as well superimposed chronic hemosiderin. No intracranial mass effect. No left hemisphere or posterior fossa restricted diffusion. No ventriculomegaly. Negative pituitary and cervicomedullary junction. No extra-axial collection. Left basal ganglia, thalami, brainstem, and cerebellum remain normal. Minimal nonspecific white matter signal changes in the left hemisphere. Vascular: Major intracranial vascular flow voids are stable since 2011. Skull and upper cervical spine: Negative. Stable bone marrow signal since 2011. Sinuses/Orbits: Interval postoperative changes to the left globe. Otherwise negative. Mild paranasal sinus mucosal thickening and mild left mastoid effusion are stable. Other: Visible internal auditory structures appear normal. Negative scalp soft  tissues. IMPRESSION: Acute and subacute on chronic right MCA territory infarcts. Petechial hemorrhage but no malignant hemorrhagic transformation or intracranial mass effect. Electronically Signed   By: Genevie Ann M.D.   On: 01/17/2016 21:38   [name] Head/brain [id number] Cm  Result Date: 01/18/2016 CLINICAL DATA:  CVA. EXAM: MRA HEAD WITHOUT CONTRAST TECHNIQUE: Angiographic images of the Circle of Willis were obtained using MRA technique without intravenous contrast. COMPARISON:  02/18/2010 FINDINGS: Right ICA is smaller than the left to a mild degree. At the skullbase there is focal aspherical morphology. No definite intramural hematoma seen in this region on conventional MRI yesterday. Moderate to advanced focal narrowing of the supraclinoid right ICA which is new from 2011. There is a tight stenosis with flow gap at the proximal right M1 segment that is new from prior. Similar appearance was seen at the right M1 bifurcation previously; this area is irregular and mildly narrowed today. No signs of intramural hematoma/ dissection on conventional MRI. The downstream vessels are asymmetrically under filled/enhanced. Advanced left M2 branch stenosis, not seen previously. Mild left vertebral artery dominance. No right PICA seen. Tandem mild narrowings of the the right P2 segment which is chronic and likely atherosclerotic. No major branch occlusion. Negative for aneurysm.  No evidence of vascular malformation. IMPRESSION: 1. 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 Development since 2011 favors thrombosis or embolus. Moderate to advanced right supraclinoid ICA narrowing is also new from 2011. 2. Irregularity and mild narrowing at the previously severe distal right M1 stenosis seen in 2011. 3. Left M2 and right P2 narrowings that are likely atheromatous. 4. Asymmetric smaller right ICA with mild narrowing at the skullbase, new from 2011. Suggest cervical ICA workup. Electronically Signed   By: Monte Fantasia M.D.    On: 01/18/2016 08:11   Time spent: 30 minutes  Signed:  Berle Mull  [facility] 01/19/2016   , 11:42 PM

## 2016-04-15 ENCOUNTER — Encounter (HOSPITAL_COMMUNITY): Payer: Self-pay | Admitting: *Deleted

## 2016-04-15 ENCOUNTER — Inpatient Hospital Stay (HOSPITAL_COMMUNITY)
Admission: EM | Admit: 2016-04-15 | Discharge: 2016-04-17 | DRG: 065 | Disposition: A | Discharge status: Home / Self Care | Payer: Federal, State, Local not specified - PPO | Attending: Internal Medicine | Admitting: Internal Medicine

## 2016-04-15 ENCOUNTER — Emergency Department (HOSPITAL_COMMUNITY): Payer: Federal, State, Local not specified - PPO

## 2016-04-15 ENCOUNTER — Ambulatory Visit (HOSPITAL_COMMUNITY)
Admit: 2016-04-15 | Discharge: 2016-04-15 | Disposition: A | Discharge status: Home / Self Care | Payer: Federal, State, Local not specified - PPO | Attending: Internal Medicine | Admitting: Internal Medicine

## 2016-04-15 ENCOUNTER — Observation Stay (HOSPITAL_COMMUNITY): Payer: Federal, State, Local not specified - PPO

## 2016-04-15 ENCOUNTER — Other Ambulatory Visit (HOSPITAL_COMMUNITY): Payer: Federal, State, Local not specified - PPO

## 2016-04-15 DIAGNOSIS — I639 Cerebral infarction, unspecified: Secondary | ICD-10-CM | POA: Diagnosis not present

## 2016-04-15 DIAGNOSIS — Z79899 Other long term (current) drug therapy: Secondary | ICD-10-CM

## 2016-04-15 DIAGNOSIS — Z8673 Personal history of transient ischemic attack (TIA), and cerebral infarction without residual deficits: Secondary | ICD-10-CM

## 2016-04-15 DIAGNOSIS — IMO0002 Reserved for concepts with insufficient information to code with codable children: Secondary | ICD-10-CM | POA: Diagnosis present

## 2016-04-15 DIAGNOSIS — R4781 Slurred speech: Secondary | ICD-10-CM | POA: Diagnosis present

## 2016-04-15 DIAGNOSIS — E1349 Other specified diabetes mellitus with other diabetic neurological complication: Secondary | ICD-10-CM | POA: Diagnosis present

## 2016-04-15 DIAGNOSIS — I638 Other cerebral infarction: Secondary | ICD-10-CM | POA: Diagnosis not present

## 2016-04-15 DIAGNOSIS — R001 Bradycardia, unspecified: Secondary | ICD-10-CM | POA: Diagnosis present

## 2016-04-15 DIAGNOSIS — Z9114 Patient's other noncompliance with medication regimen: Secondary | ICD-10-CM

## 2016-04-15 DIAGNOSIS — G47419 Narcolepsy without cataplexy: Secondary | ICD-10-CM | POA: Diagnosis present

## 2016-04-15 DIAGNOSIS — R2689 Other abnormalities of gait and mobility: Secondary | ICD-10-CM

## 2016-04-15 DIAGNOSIS — I340 Nonrheumatic mitral (valve) insufficiency: Secondary | ICD-10-CM | POA: Diagnosis present

## 2016-04-15 DIAGNOSIS — Z794 Long term (current) use of insulin: Secondary | ICD-10-CM

## 2016-04-15 DIAGNOSIS — G4733 Obstructive sleep apnea (adult) (pediatric): Secondary | ICD-10-CM | POA: Diagnosis present

## 2016-04-15 DIAGNOSIS — Z7902 Long term (current) use of antithrombotics/antiplatelets: Secondary | ICD-10-CM

## 2016-04-15 DIAGNOSIS — R2990 Unspecified symptoms and signs involving the nervous system: Secondary | ICD-10-CM | POA: Diagnosis present

## 2016-04-15 DIAGNOSIS — R269 Unspecified abnormalities of gait and mobility: Secondary | ICD-10-CM | POA: Diagnosis present

## 2016-04-15 DIAGNOSIS — E1365 Other specified diabetes mellitus with hyperglycemia: Secondary | ICD-10-CM

## 2016-04-15 DIAGNOSIS — I6381 Other cerebral infarction due to occlusion or stenosis of small artery: Secondary | ICD-10-CM

## 2016-04-15 DIAGNOSIS — E1165 Type 2 diabetes mellitus with hyperglycemia: Secondary | ICD-10-CM | POA: Diagnosis present

## 2016-04-15 DIAGNOSIS — E1151 Type 2 diabetes mellitus with diabetic peripheral angiopathy without gangrene: Secondary | ICD-10-CM | POA: Diagnosis present

## 2016-04-15 DIAGNOSIS — Z87891 Personal history of nicotine dependence: Secondary | ICD-10-CM

## 2016-04-15 DIAGNOSIS — E11649 Type 2 diabetes mellitus with hypoglycemia without coma: Secondary | ICD-10-CM | POA: Diagnosis present

## 2016-04-15 DIAGNOSIS — I10 Essential (primary) hypertension: Secondary | ICD-10-CM | POA: Diagnosis present

## 2016-04-15 DIAGNOSIS — Z7982 Long term (current) use of aspirin: Secondary | ICD-10-CM

## 2016-04-15 DIAGNOSIS — G8194 Hemiplegia, unspecified affecting left nondominant side: Secondary | ICD-10-CM | POA: Diagnosis present

## 2016-04-15 DIAGNOSIS — N289 Disorder of kidney and ureter, unspecified: Secondary | ICD-10-CM

## 2016-04-15 DIAGNOSIS — R29810 Facial weakness: Secondary | ICD-10-CM | POA: Diagnosis present

## 2016-04-15 DIAGNOSIS — H409 Unspecified glaucoma: Secondary | ICD-10-CM | POA: Diagnosis present

## 2016-04-15 LAB — DIFFERENTIAL
BASOS ABS: 0 10*3/uL (ref 0.0–0.1)
BASOS PCT: 0 %
EOS ABS: 0.1 10*3/uL (ref 0.0–0.7)
Eosinophils Relative: 2 %
Lymphocytes Relative: 37 %
Lymphs Abs: 2 10*3/uL (ref 0.7–4.0)
MONO ABS: 0.4 10*3/uL (ref 0.1–1.0)
MONOS PCT: 7 %
NEUTROS ABS: 3 10*3/uL (ref 1.7–7.7)
Neutrophils Relative %: 54 %

## 2016-04-15 LAB — CBC
HEMATOCRIT: 36.3 % — AB (ref 39.0–52.0)
Hemoglobin: 11.9 g/dL — ABNORMAL LOW (ref 13.0–17.0)
MCH: 29.8 pg (ref 26.0–34.0)
MCHC: 32.8 g/dL (ref 30.0–36.0)
MCV: 91 fL (ref 78.0–100.0)
PLATELETS: 229 10*3/uL (ref 150–400)
RBC: 3.99 MIL/uL — ABNORMAL LOW (ref 4.22–5.81)
RDW: 12.1 % (ref 11.5–15.5)
WBC: 5.4 10*3/uL (ref 4.0–10.5)

## 2016-04-15 LAB — URINALYSIS, ROUTINE W REFLEX MICROSCOPIC
Bilirubin Urine: NEGATIVE
Glucose, UA: NEGATIVE mg/dL
Hgb urine dipstick: NEGATIVE
Ketones, ur: NEGATIVE mg/dL
Leukocytes, UA: NEGATIVE
Nitrite: NEGATIVE
Protein, ur: 100 mg/dL — AB
Specific Gravity, Urine: 1.018 (ref 1.005–1.030)
pH: 5 (ref 5.0–8.0)

## 2016-04-15 LAB — I-STAT CHEM 8, ED
BUN: 17 mg/dL (ref 6–20)
CALCIUM ION: 1.2 mmol/L (ref 1.15–1.40)
Chloride: 103 mmol/L (ref 101–111)
Creatinine, Ser: 1.1 mg/dL (ref 0.61–1.24)
GLUCOSE: 175 mg/dL — AB (ref 65–99)
HCT: 36 % — ABNORMAL LOW (ref 39.0–52.0)
HEMOGLOBIN: 12.2 g/dL — AB (ref 13.0–17.0)
Potassium: 3.8 mmol/L (ref 3.5–5.1)
SODIUM: 143 mmol/L (ref 135–145)
TCO2: 26 mmol/L (ref 0–100)

## 2016-04-15 LAB — GLUCOSE, CAPILLARY
Glucose-Capillary: 91 mg/dL (ref 65–99)
Glucose-Capillary: 125 mg/dL — ABNORMAL HIGH (ref 65–99)

## 2016-04-15 LAB — COMPREHENSIVE METABOLIC PANEL
ALT: 14 U/L — ABNORMAL LOW (ref 17–63)
AST: 17 U/L (ref 15–41)
Albumin: 3.5 g/dL (ref 3.5–5.0)
Alkaline Phosphatase: 67 U/L (ref 38–126)
Anion gap: 9 (ref 5–15)
BUN: 16 mg/dL (ref 6–20)
CHLORIDE: 106 mmol/L (ref 101–111)
CO2: 26 mmol/L (ref 22–32)
Calcium: 8.9 mg/dL (ref 8.9–10.3)
Creatinine, Ser: 1.22 mg/dL (ref 0.61–1.24)
Glucose, Bld: 170 mg/dL — ABNORMAL HIGH (ref 65–99)
POTASSIUM: 3.9 mmol/L (ref 3.5–5.1)
SODIUM: 141 mmol/L (ref 135–145)
Total Bilirubin: 0.7 mg/dL (ref 0.3–1.2)
Total Protein: 6.2 g/dL — ABNORMAL LOW (ref 6.5–8.1)

## 2016-04-15 LAB — CBG MONITORING, ED
GLUCOSE-CAPILLARY: 166 mg/dL — AB (ref 65–99)
GLUCOSE-CAPILLARY: 28 mg/dL — AB (ref 65–99)
Glucose-Capillary: 89 mg/dL (ref 65–99)

## 2016-04-15 LAB — PROTIME-INR
INR: 1.07
PROTHROMBIN TIME: 14 s (ref 11.4–15.2)

## 2016-04-15 LAB — ETHANOL: Alcohol, Ethyl (B): <5 mg/dL (ref <5)

## 2016-04-15 LAB — RAPID URINE DRUG SCREEN, HOSP PERFORMED
Amphetamines: NOT DETECTED
Barbiturates: NOT DETECTED
Benzodiazepines: NOT DETECTED
Cocaine: NOT DETECTED
Opiates: NOT DETECTED
Tetrahydrocannabinol: NOT DETECTED

## 2016-04-15 LAB — APTT: APTT: 33 s (ref 24–36)

## 2016-04-15 LAB — TSH: TSH: 0.755 u[IU]/mL (ref 0.350–4.500)

## 2016-04-15 LAB — I-STAT TROPONIN, ED: TROPONIN I, POC: 0.01 ng/mL (ref 0.00–0.08)

## 2016-04-15 MED ORDER — DEXTROSE 50 % IV SOLN
INTRAVENOUS | Status: AC
  Filled 2016-04-15: qty 50

## 2016-04-15 MED ORDER — ACETAMINOPHEN 650 MG RE SUPP
650.0000 mg | RECTAL | Status: DC | PRN
Start: 2016-04-15 — End: 2016-04-17

## 2016-04-15 MED ORDER — SENNOSIDES-DOCUSATE SODIUM 8.6-50 MG PO TABS: 1.0000 | ORAL_TABLET | Freq: Every evening | ORAL | Status: DC | PRN

## 2016-04-15 MED ORDER — INSULIN ASPART 100 UNIT/ML ~~LOC~~ SOLN
0.0000 [IU] | Freq: Three times a day (TID) | SUBCUTANEOUS | Status: DC
  Administered 2016-04-16 – 2016-04-17 (×3): 5 [IU] via SUBCUTANEOUS
  Administered 2016-04-17: 3 [IU] via SUBCUTANEOUS

## 2016-04-15 MED ORDER — INSULIN ASPART 100 UNIT/ML ~~LOC~~ SOLN
0.0000 [IU] | Freq: Every day | SUBCUTANEOUS | Status: DC
  Administered 2016-04-16: 3 [IU] via SUBCUTANEOUS

## 2016-04-15 MED ORDER — SODIUM CHLORIDE 0.9 % IV SOLN
INTRAVENOUS | Status: AC
  Administered 2016-04-15: 14:00:00 via INTRAVENOUS

## 2016-04-15 MED ORDER — DEXTROSE 50 % IV SOLN
1.0000 | Freq: Once | INTRAVENOUS | Status: AC
  Administered 2016-04-15: 50 mL via INTRAVENOUS

## 2016-04-15 MED ORDER — CLOPIDOGREL BISULFATE 75 MG PO TABS
75.0000 mg | ORAL_TABLET | Freq: Every day | ORAL | Status: DC
  Administered 2016-04-16 – 2016-04-17 (×2): 75 mg via ORAL
  Filled 2016-04-15 (×2): qty 1

## 2016-04-15 MED ORDER — INSULIN GLARGINE 100 UNIT/ML ~~LOC~~ SOLN: 35.0000 [IU] | Freq: Every day | SUBCUTANEOUS | Status: DC

## 2016-04-15 MED ORDER — ACETAMINOPHEN 325 MG PO TABS: 650.0000 mg | ORAL_TABLET | ORAL | Status: DC | PRN

## 2016-04-15 MED ORDER — VITAMIN D 1000 UNITS PO TABS
1000.0000 [IU] | ORAL_TABLET | Freq: Every day | ORAL | Status: DC
  Administered 2016-04-16 – 2016-04-17 (×2): 1000 [IU] via ORAL
  Filled 2016-04-15 (×2): qty 1

## 2016-04-15 MED ORDER — TIMOLOL MALEATE 0.5 % OP SOLG
1.0000 [drp] | Freq: Every day | OPHTHALMIC | Status: DC
  Administered 2016-04-16 – 2016-04-17 (×2): 1 [drp] via OPHTHALMIC
  Filled 2016-04-15: qty 5

## 2016-04-15 MED ORDER — STROKE: EARLY STAGES OF RECOVERY BOOK
Freq: Once | Status: AC
  Administered 2016-04-15: 14:00:00
  Filled 2016-04-15: qty 1

## 2016-04-15 MED ORDER — DORZOLAMIDE HCL-TIMOLOL MAL 2-0.5 % OP SOLN
1.0000 [drp] | Freq: Two times a day (BID) | OPHTHALMIC | Status: DC
  Administered 2016-04-15 – 2016-04-17 (×4): 1 [drp] via OPHTHALMIC
  Filled 2016-04-15: qty 10

## 2016-04-15 MED ORDER — ACETAMINOPHEN 160 MG/5ML PO SOLN: 650.0000 mg | ORAL | Status: DC | PRN

## 2016-04-15 MED ORDER — ATORVASTATIN CALCIUM 80 MG PO TABS
80.0000 mg | ORAL_TABLET | Freq: Every day | ORAL | Status: DC
  Administered 2016-04-15 – 2016-04-16 (×2): 80 mg via ORAL
  Filled 2016-04-15 (×2): qty 1

## 2016-04-15 NOTE — ED Notes (Signed)
EKG given to Dr. Goldston  

## 2016-04-15 NOTE — ED Provider Notes (Signed)
[facility] DEPT Provider Note   CSN: [id number] Arrival date & time: 04/15/16  1109     History   Chief Complaint Chief Complaint  Patient presents with  . Cerebrovascular Accident    HPI Cory Carpenter is a 56 y.o. male.  HPI   56 year old male with a history of diabetes, glaucoma, hypertension, narcolepsy, stroke presents today with strokelike symptoms.  Patient notes that yesterday morning he developed paralysis of the left side of his face, weakness in his left lower extremity, drooling.  Patient notes that he has a history of stroke in the past, with the only deficits left from that are abnormal coordination with his left hand.  Patient notes that he has been taking all of his medications as directed.  He notes his blood sugars have been running in the 200s, and was noted to be 50 this morning.  Patient denies any trauma.   Past Medical History:  Diagnosis Date  . Diabetes mellitus without complication ([place])   . Glaucoma   . Hypertension   . Narcolepsy   . Sleep apnea   . Stroke [facility])     Patient Active Problem List   Diagnosis Date Noted  . Stroke-like symptoms 04/15/2016  . Bradycardia 04/15/2016  . Stroke ([place]) 04/15/2016  . Noncompliance with medications   . Intracranial carotid stenosis, right   . CVA (cerebral vascular accident) ([place]) 01/17/2016  . DM (diabetes mellitus), secondary, uncontrolled, w/neurologic complic ([place]) 123456  . Renal insufficiency 01/17/2016  . Essential hypertension 10/22/2008  . PLEURAL EFFUSION 10/22/2008    History reviewed. No pertinent surgical history.    Home Medications    Prior to Admission medications   Medication Sig Start Date End Date Taking? Authorizing Provider  aspirin EC 81 MG tablet Take 81 mg by mouth daily.   Yes Historical Provider, MD  atorvastatin (LIPITOR) 80 MG tablet Take 1 tablet (80 mg total) by mouth daily at 6 PM. 01/19/16  Yes Lavina Hamman, MD  Brinzolamide-Brimonidine 1-0.2 % SUSP Place  1 drop into both eyes 3 (three) times daily.   Yes Historical Provider, MD  cholecalciferol (VITAMIN D) 1000 units tablet Take 1,000 Units by mouth daily.   Yes Historical Provider, MD  clopidogrel (PLAVIX) 75 MG tablet Take 1 tablet (75 mg total) by mouth daily. 01/20/16  Yes Lavina Hamman, MD  dorzolamide-timolol (COSOPT) 22.3-6.8 MG/ML ophthalmic solution Place 1 drop into the left eye 2 (two) times daily.   Yes Historical Provider, MD  insulin aspart (NOVOLOG) 100 UNIT/ML injection Inject 10 Units into the skin 3 (three) times daily before meals. 01/19/16  Yes Lavina Hamman, MD  insulin glargine (LANTUS) 100 UNIT/ML injection Inject 50 Units into the skin daily.   Yes Historical Provider, MD  lisinopril (PRINIVIL,ZESTRIL) 40 MG tablet Take 40 mg by mouth daily.   Yes Historical Provider, MD  metFORMIN (GLUCOPHAGE-XR) 500 MG 24 hr tablet Take 1,000 mg by mouth 2 (two) times daily.   Yes Historical Provider, MD  timolol (TIMOPTIC-XR) 0.5 % ophthalmic gel-forming Place 1 drop into both eyes daily.   Yes Historical Provider, MD    Family History No family history on file.  Social History Social History  Substance Use Topics  . Smoking status: Former Research scientist (life sciences)  . Smokeless tobacco: Never Used  . Alcohol use Yes     Comment: occ      Allergies   Patient has no known allergies.   Review of Systems Review of Systems  All other  systems reviewed and are negative.    Physical Exam Updated Vital Signs BP 187/88   Pulse (!) 50   Temp 97.5 F (36.4 C)   Resp 25   Ht 5\' 10"  (1.778 m)   Wt 107.5 kg   SpO2 100%   BMI 34.01 kg/m   Physical Exam  Constitutional: He is oriented to person, place, and time. He appears well-developed and well-nourished.  HENT:  Head: Normocephalic and atraumatic.  Eyes: Conjunctivae are normal. Pupils are equal, round, and reactive to light. Right eye exhibits no discharge. Left eye exhibits no discharge. No scleral icterus.  Neck: Normal range of  motion. No JVD present. No tracheal deviation present.  Pulmonary/Chest: Effort normal. No stridor.  Neurological: He is alert and oriented to person, place, and time. A cranial nerve deficit is present. Coordination normal.  Left-sided facial droop.  Pupils equal round reactive to light, extraocular movements are intact.   Bilateral upper extremity strength 5 out of 5 sensation intact; slightly decreased on the left.  Bilateral lower extremity strength 5 out of 5, left decreased compared to right, sensation slightly reduced.  NIH 7    Skin: Skin is warm.  Psychiatric: He has a normal mood and affect. His behavior is normal. Judgment and thought content normal.  Nursing note and vitals reviewed.    ED Treatments / Results  Labs (all labs ordered are listed, but only abnormal results are displayed) Labs Reviewed  CBC - Abnormal; Notable for the following:       Result Value   RBC 3.99 (*)    Hemoglobin 11.9 (*)    HCT 36.3 (*)    All other components within normal limits  COMPREHENSIVE METABOLIC PANEL - Abnormal; Notable for the following:    Glucose, Bld 170 (*)    Total Protein 6.2 (*)    ALT 14 (*)    All other components within normal limits  CBG MONITORING, ED - Abnormal; Notable for the following:    Glucose-Capillary 166 (*)    All other components within normal limits  I-STAT CHEM 8, ED - Abnormal; Notable for the following:    Glucose, Bld 175 (*)    Hemoglobin 12.2 (*)    HCT 36.0 (*)    All other components within normal limits  PROTIME-INR  APTT  DIFFERENTIAL  ETHANOL  TSH  RAPID URINE DRUG SCREEN, HOSP PERFORMED  URINALYSIS, ROUTINE W REFLEX MICROSCOPIC  HIV ANTIBODY (ROUTINE TESTING)  I-STAT TROPOININ, ED  I-STAT CHEM 8, ED  I-STAT TROPOININ, ED    EKG  EKG Interpretation  Date/Time:  Friday April 15 2016 11:39:27 EST Ventricular Rate:  52 PR Interval:    QRS Duration: 91 QT Interval:  406 QTC Calculation: 378 R Axis:   -23 Text  Interpretation:  Sinus rhythm Borderline left axis deviation no significant change since Nov 2017 Confirmed by Regenia Skeeter MD, SCOTT [phone]) on 04/15/2016 11:45:50 AM       Radiology Dg Chest 2 View  Result Date: 04/15/2016 CLINICAL DATA:  Slurred speech with facial droop EXAM: CHEST  2 VIEW COMPARISON:  January 18, 2016 FINDINGS: There is mild scarring in the left lower lobe region. There is no edema or consolidation. Heart size and pulmonary vascularity are normal. No adenopathy. There is evidence of old rib trauma on the left, healed. IMPRESSION: Mild scarring left lower lobe region. No edema or consolidation. Stable cardiac silhouette. Electronically Signed   By: Lowella Grip III M.D.   On: 04/15/2016 14:22  Ct Head Wo Contrast  Result Date: 04/15/2016 CLINICAL DATA:  Slurred speech, left-sided weakness. EXAM: CT HEAD WITHOUT CONTRAST TECHNIQUE: Contiguous axial images were obtained from the base of the skull through the vertex without intravenous contrast. COMPARISON:  CT scan of January 17, 2016. FINDINGS: Brain: Stable right frontal and parietal encephalomalacia is noted consistent with old infarctions. No mass effect or midline shift is noted. Ventricular size is within normal limits. There is no evidence of mass lesion, hemorrhage or acute infarction. Vascular: Atherosclerosis of carotid siphons is noted. Skull: Normal. Negative for fracture or focal lesion. Sinuses/Orbits: No acute finding. Other: None. IMPRESSION: Old right frontal and parietal infarctions. No acute intracranial abnormality seen. Electronically Signed   By: Marijo Conception, M.D.   On: 04/15/2016 12:01    Procedures Procedures (including critical care time)  Medications Ordered in ED Medications  timolol (TIMOPTIC-XR) 0.5 % ophthalmic gel-forming 1 drop (not administered)  atorvastatin (LIPITOR) tablet 80 mg (not administered)  cholecalciferol (VITAMIN D) tablet 1,000 Units (not administered)  clopidogrel  (PLAVIX) tablet 75 mg (not administered)  dorzolamide-timolol (COSOPT) 22.3-6.8 MG/ML ophthalmic solution 1 drop (not administered)   stroke: mapping our early stages of recovery book (not administered)  0.9 %  sodium chloride infusion (not administered)  acetaminophen (TYLENOL) tablet 650 mg (not administered)    Or  acetaminophen (TYLENOL) solution 650 mg (not administered)    Or  acetaminophen (TYLENOL) suppository 650 mg (not administered)  senna-docusate (Senokot-S) tablet 1 tablet (not administered)  insulin glargine (LANTUS) injection 35 Units (not administered)  insulin aspart (novoLOG) injection 0-15 Units (not administered)  insulin aspart (novoLOG) injection 0-5 Units (not administered)     Initial Impression / Assessment and Plan / ED Course  I have reviewed the triage vital signs and the nursing notes.  Pertinent labs & imaging results that were available during my care of the patient were reviewed by me and considered in my medical decision making (see chart for details).     Final Clinical Impressions(s) / ED Diagnoses   Final diagnoses:  Stroke-like symptoms  Stroke-like symptoms  Stroke-like symptoms    Labs:    Imaging:  Consults:  Therapeutics:  Discharge Meds:   Assessment/Plan: 56 year old male presents today with strokelike symptoms.  Patient has a history of the same in the past.  Symptoms started yesterday, no code stroke activated.  Patient will have head CT, followed by neurology consult.   CT shows no acute findings-neurology consulted.  Patient will be admitted to the hospital for ongoing evaluation.  [facility] consulted who agreed for evaluation and admission.  Reassessment of patient shows no worsening of symptoms.     New Prescriptions New Prescriptions   No medications on file     Okey Regal, PA-C 04/15/16 [place], MD 04/22/16 [phone]

## 2016-04-15 NOTE — Progress Notes (Signed)
VASCULAR LAB PRELIMINARY  PRELIMINARY  PRELIMINARY  PRELIMINARY  Carotid duplex completed.    Preliminary report:  Bilateral - No evidence of significant ICA stenosis. Vertebral artery flow is antegrade.  Cory Carpenter, [place], RVS 04/15/2016, 5:37 PM

## 2016-04-15 NOTE — ED Triage Notes (Signed)
Pt in from home, per wife pt had onset slurred speech weds, pts wife reports having slurred speech worsening yesterday morning, pts wife reports having facial droop & drooling worsening  & L sided leg weakness onset yesterday, airway intact

## 2016-04-15 NOTE — ED Notes (Signed)
Pt returned to room and placed back on monitor.  

## 2016-04-15 NOTE — ED Notes (Signed)
Pt CBG was 166, notified Jamie(RN)

## 2016-04-15 NOTE — ED Notes (Signed)
Pt CBG was 89, notified Taylor(RN)

## 2016-04-15 NOTE — ED Notes (Addendum)
Pt transported to XR.  

## 2016-04-15 NOTE — ED Notes (Signed)
Pt transported to Vascular 

## 2016-04-15 NOTE — ED Notes (Signed)
Patient transported to CT 

## 2016-04-15 NOTE — H&P (Signed)
History and Physical    Cory Carpenter [id number] DOB: 06-23-1960 DOA: 04/15/2016  PCP: [facility] Patient coming from: home  Chief Complaint: slurred speech, worsening gait and facial droop  HPI: Cory Carpenter is a very pleasant 56 y.o. male with medical history significant for stroke in November 2017, hypertension, diabetes sent to the emergency Department chief complaint slurred speech worsening facial droop.  Information is obtained from the patient and his wife is at the bedside. Wife reports she noticed intermittent slurred speech over the last 2 days with the became more marketed yesterday. Associated symptoms include worsening left-sided weakness with slight gait disturbance and worsening facial droop. He denies headache dizziness syncope or near-syncope. He denies visual disturbances numbness tingling of the face arms legs. He denies any difficulty chewing or swallowing. Denies chest pain palpitation shortness of breath. Denies abdominal pain nausea vomiting diarrhea constipation. He denies dysuria hematuria frequency or urgency. Wife is not sure he is completely compliant with his antihypertensive medications. She states she "sets them up for him" but did not realize that he ran out. Reportedly went to [place] last week and a systolic blood pressure was over 200.  ED Course: In the emergency department he's afebrile hemodynamically stable and not hypoxic  Review of Systems: As per HPI otherwise 10 point review of systems negative.   Ambulatory Status: independant  Past Medical History:  Diagnosis Date  . Diabetes mellitus without complication ([place])   . Glaucoma   . Hypertension   . Narcolepsy   . Sleep apnea   . Stroke [facility])     History reviewed. No pertinent surgical history.  Social History   Social History  . Marital status: Married    Spouse name: N/A  . Number of children: N/A  . Years of education: N/A   Occupational History  . Not on file.   Social  History Main Topics  . Smoking status: Former Research scientist (life sciences)  . Smokeless tobacco: Never Used  . Alcohol use Yes     Comment: occ   . Drug use: No  . Sexual activity: Yes   Other Topics Concern  . Not on file   Social History Narrative  . No narrative on file    No Known Allergies  No family history on file.  Prior to Admission medications   Medication Sig Start Date End Date Taking? Authorizing Provider  aspirin EC 81 MG tablet Take 81 mg by mouth daily.   Yes Historical Provider, MD  atorvastatin (LIPITOR) 80 MG tablet Take 1 tablet (80 mg total) by mouth daily at 6 PM. 01/19/16  Yes Lavina Hamman, MD  Brinzolamide-Brimonidine 1-0.2 % SUSP Place 1 drop into both eyes 3 (three) times daily.   Yes Historical Provider, MD  cholecalciferol (VITAMIN D) 1000 units tablet Take 1,000 Units by mouth daily.   Yes Historical Provider, MD  clopidogrel (PLAVIX) 75 MG tablet Take 1 tablet (75 mg total) by mouth daily. 01/20/16  Yes Lavina Hamman, MD  dorzolamide-timolol (COSOPT) 22.3-6.8 MG/ML ophthalmic solution Place 1 drop into the left eye 2 (two) times daily.   Yes Historical Provider, MD  insulin aspart (NOVOLOG) 100 UNIT/ML injection Inject 10 Units into the skin 3 (three) times daily before meals. 01/19/16  Yes Lavina Hamman, MD  insulin glargine (LANTUS) 100 UNIT/ML injection Inject 50 Units into the skin daily.   Yes Historical Provider, MD  lisinopril (PRINIVIL,ZESTRIL) 40 MG tablet Take 40 mg by mouth daily.   Yes Historical Provider,  MD  metFORMIN (GLUCOPHAGE-XR) 500 MG 24 hr tablet Take 1,000 mg by mouth 2 (two) times daily.   Yes Historical Provider, MD  timolol (TIMOPTIC-XR) 0.5 % ophthalmic gel-forming Place 1 drop into both eyes daily.   Yes Historical Provider, MD    Physical Exam: Vitals:   04/15/16 1300 04/15/16 1315 04/15/16 1345 04/15/16 1400  BP: 162/71 159/71 145/81 156/66  Pulse: (!) 50 (!) 46 (!) 49 (!) 55  Resp: 17 16 17 12   Temp:      TempSrc:      SpO2: 99% 99%  98% 100%  Weight:      Height:         General:  Appears calm and comfortableNo acute distress Eyes:  PERRL, EOMI, normal lids, iris ENT:  grossly normal hearing, lips & tongue, mmm, tongue deviates lightly Neck:  no LAD, masses or thyromegaly Cardiovascular:  Bradycardia but regular I hear no murmur gallop no lower extremity edema Respiratory:  CTA bilaterally, no w/r/r. Normal respiratory effort. Abdomen:  soft, ntnd, obese positive bowel sounds Skin:  no rash or induration seen on limited exam Musculoskeletal:  grossly normal tone BUE/BLE, good ROM, no bony abnormality, joints without swelling/erythema Psychiatric:  grossly normal mood and affect, speech fluent and appropriate, AOx3 Neurologic:  Alert oriented 3 speech is very slow slightly slurred obvious facial droop. Extremity strength left 4 out of 5 right 5 out of 5 lower extremity strength left 4 out of 5 right 5 out of 5 follows commands  Labs on Admission: I have personally reviewed following labs and imaging studies  CBC:  Recent Labs Lab 04/15/16 1207 04/15/16 1226  WBC 5.4  --   NEUTROABS 3.0  --   HGB 11.9* 12.2*  HCT 36.3* 36.0*  MCV 91.0  --   PLT 229  --    Basic Metabolic Panel:  Recent Labs Lab 04/15/16 1207 04/15/16 1226  NA 141 143  K 3.9 3.8  CL 106 103  CO2 26  --   GLUCOSE 170* 175*  BUN 16 17  CREATININE 1.22 1.10  CALCIUM 8.9  --    GFR: Estimated Creatinine Clearance: 94.3 mL/min (by C-G formula based on SCr of 1.1 mg/dL). Liver Function Tests:  Recent Labs Lab 04/15/16 1207  AST 17  ALT 14*  ALKPHOS 67  BILITOT 0.7  PROT 6.2*  ALBUMIN 3.5   No results for input(s): LIPASE, AMYLASE in the last 168 hours. No results for input(s): AMMONIA in the last 168 hours. Coagulation Profile:  Recent Labs Lab 04/15/16 1207  INR 1.07   Cardiac Enzymes: No results for input(s): CKTOTAL, CKMB, CKMBINDEX, TROPONINI in the last 168 hours. BNP (last 3 results) No results for  input(s): PROBNP in the last 8760 hours. HbA1C: No results for input(s): HGBA1C in the last 72 hours. CBG:  Recent Labs Lab 04/15/16 1234  GLUCAP 166*   Lipid Profile: No results for input(s): CHOL, HDL, LDLCALC, TRIG, CHOLHDL, LDLDIRECT in the last 72 hours. Thyroid Function Tests: No results for input(s): TSH, T4TOTAL, FREET4, T3FREE, THYROIDAB in the last 72 hours. Anemia Panel: No results for input(s): VITAMINB12, FOLATE, FERRITIN, TIBC, IRON, RETICCTPCT in the last 72 hours. Urine analysis:    Component Value Date/Time   COLORURINE YELLOW 02/16/2010 [place] 02/16/2010 1154   LABSPEC 1.026 02/16/2010 1154   PHURINE 6.5 02/16/2010 1154   GLUCOSEU >1000 (A) 02/16/2010 1154   HGBUR NEGATIVE 02/16/2010 [place] 02/16/2010 1154  KETONESUR 15 (A) 02/16/2010 1154   PROTEINUR NEGATIVE 02/16/2010 1154   UROBILINOGEN 0.2 02/16/2010 1154   NITRITE NEGATIVE 02/16/2010 1154   LEUKOCYTESUR NEGATIVE 02/16/2010 1154    Creatinine Clearance: Estimated Creatinine Clearance: 94.3 mL/min (by C-G formula based on SCr of 1.1 mg/dL).  Sepsis Labs: @LABRCNTIP (procalcitonin:4,lacticidven:4) )No results found for this or any previous visit (from the past 240 hour(s)).   Radiological Exams on Admission: Ct Head Wo Contrast  Result Date: 04/15/2016 CLINICAL DATA:  Slurred speech, left-sided weakness. EXAM: CT HEAD WITHOUT CONTRAST TECHNIQUE: Contiguous axial images were obtained from the base of the skull through the vertex without intravenous contrast. COMPARISON:  CT scan of January 17, 2016. FINDINGS: Brain: Stable right frontal and parietal encephalomalacia is noted consistent with old infarctions. No mass effect or midline shift is noted. Ventricular size is within normal limits. There is no evidence of mass lesion, hemorrhage or acute infarction. Vascular: Atherosclerosis of carotid siphons is noted. Skull: Normal. Negative for fracture or focal lesion.  Sinuses/Orbits: No acute finding. Other: None. IMPRESSION: Old right frontal and parietal infarctions. No acute intracranial abnormality seen. Electronically Signed   By: Marijo Conception, M.D.   On: 04/15/2016 12:01    EKG: Independently reviewed. Sinus rhythm Borderline left axis deviation no significant change since Nov 2017   Assessment/Plan Principal Problem:   Stroke-like symptoms Active Problems:   Essential hypertension   DM (diabetes mellitus), secondary, uncontrolled, w/neurologic complic ([facility])   Renal insufficiency   Bradycardia   #1. Strokelike symptoms/slurred speech/facial droop/gait disturbance. History of stroke 2-1/2 months ago. Left sided weakness as residual. Wife reports marketed change in gait and facial droop and speech. Symptoms started intermittently 2 days ago. CT of the head reveals old right frontal and parietal infarcts no acute intracranial abnormality. EKG without acute changes. Patient is on Plavix and aspirin from previous stroke as well as a statin. Chart review indicates patient with a history of noncompliance with antihypertensive medications and diabetes control -Admit to telemetry -MRI MRA of the brain -follow chest xray - follow urinalysis -Carotid Doppler and 2-D echo -Lipid panel and hemoglobin A1c -Physical therapy, occupational therapy, speech therapy -Carb modified heart healthy diet once he passed the bedside swallow eval -Continue Plavix and statin and aspirin -Hold his lisinopril for now -await neuro recommendations  #2. Diabetes. Serum glucose 170 on admission. A1c 14.2 in November 2017. Patient with a history of noncompliance/uncontrolled diabetes. Home regimen includes Lantus 50 units as well as metformin -Hold metformin for now -Provide Lantus at a lower dose -Obtain a hemoglobin A1c -Riding scale for optimal control  3. Hypertension. Medications include lisinopril. Fair control. -Hold lisinopril for now -monitor closely -resume  lisinopril as indicated  4. Bradycardia. Heart rate range 46-56. Patient is not on a rate control med. Troponin negative. ekg without acute changes. No chest pain -cycle troponin -tsh -monitor  5. Renal insufficiency. Creatinine 1.10 on admission. This appears to be his baseline. -Gentle IV fluids -monitor urine     DVT prophylaxis: plavix  Code Status: full  Family Communication: wife at bedside  Disposition Plan: home  Consults called: neuro  Admission status: obs    Radene Gunning MD [facility]  If 7PM-7AM, please contact night-coverage [email or web address] Password [facility]  04/15/2016, 2:23 PM

## 2016-04-15 NOTE — ED Notes (Signed)
Pt provided with turkey sandwich.

## 2016-04-15 NOTE — ED Notes (Signed)
Attempted to call report x 1  

## 2016-04-16 ENCOUNTER — Observation Stay (HOSPITAL_BASED_OUTPATIENT_CLINIC_OR_DEPARTMENT_OTHER): Payer: Federal, State, Local not specified - PPO

## 2016-04-16 DIAGNOSIS — G8194 Hemiplegia, unspecified affecting left nondominant side: Secondary | ICD-10-CM | POA: Diagnosis present

## 2016-04-16 DIAGNOSIS — G47419 Narcolepsy without cataplexy: Secondary | ICD-10-CM | POA: Diagnosis present

## 2016-04-16 DIAGNOSIS — N289 Disorder of kidney and ureter, unspecified: Secondary | ICD-10-CM | POA: Diagnosis present

## 2016-04-16 DIAGNOSIS — E1165 Type 2 diabetes mellitus with hyperglycemia: Secondary | ICD-10-CM | POA: Diagnosis present

## 2016-04-16 DIAGNOSIS — R2990 Unspecified symptoms and signs involving the nervous system: Secondary | ICD-10-CM | POA: Diagnosis not present

## 2016-04-16 DIAGNOSIS — I638 Other cerebral infarction: Secondary | ICD-10-CM | POA: Diagnosis present

## 2016-04-16 DIAGNOSIS — Z9114 Patient's other noncompliance with medication regimen: Secondary | ICD-10-CM | POA: Diagnosis not present

## 2016-04-16 DIAGNOSIS — I6789 Other cerebrovascular disease: Secondary | ICD-10-CM | POA: Diagnosis not present

## 2016-04-16 DIAGNOSIS — I639 Cerebral infarction, unspecified: Secondary | ICD-10-CM | POA: Diagnosis not present

## 2016-04-16 DIAGNOSIS — I340 Nonrheumatic mitral (valve) insufficiency: Secondary | ICD-10-CM | POA: Diagnosis present

## 2016-04-16 DIAGNOSIS — Z7902 Long term (current) use of antithrombotics/antiplatelets: Secondary | ICD-10-CM | POA: Diagnosis not present

## 2016-04-16 DIAGNOSIS — R29810 Facial weakness: Secondary | ICD-10-CM | POA: Diagnosis present

## 2016-04-16 DIAGNOSIS — G4733 Obstructive sleep apnea (adult) (pediatric): Secondary | ICD-10-CM | POA: Diagnosis present

## 2016-04-16 DIAGNOSIS — H409 Unspecified glaucoma: Secondary | ICD-10-CM | POA: Diagnosis present

## 2016-04-16 DIAGNOSIS — E1151 Type 2 diabetes mellitus with diabetic peripheral angiopathy without gangrene: Secondary | ICD-10-CM | POA: Diagnosis present

## 2016-04-16 DIAGNOSIS — R269 Unspecified abnormalities of gait and mobility: Secondary | ICD-10-CM | POA: Diagnosis present

## 2016-04-16 DIAGNOSIS — Z79899 Other long term (current) drug therapy: Secondary | ICD-10-CM | POA: Diagnosis not present

## 2016-04-16 DIAGNOSIS — E11649 Type 2 diabetes mellitus with hypoglycemia without coma: Secondary | ICD-10-CM | POA: Diagnosis present

## 2016-04-16 DIAGNOSIS — R001 Bradycardia, unspecified: Secondary | ICD-10-CM | POA: Diagnosis present

## 2016-04-16 DIAGNOSIS — R4781 Slurred speech: Secondary | ICD-10-CM | POA: Diagnosis present

## 2016-04-16 DIAGNOSIS — I10 Essential (primary) hypertension: Secondary | ICD-10-CM | POA: Diagnosis present

## 2016-04-16 DIAGNOSIS — Z794 Long term (current) use of insulin: Secondary | ICD-10-CM | POA: Diagnosis not present

## 2016-04-16 DIAGNOSIS — Z8673 Personal history of transient ischemic attack (TIA), and cerebral infarction without residual deficits: Secondary | ICD-10-CM | POA: Diagnosis not present

## 2016-04-16 DIAGNOSIS — Z87891 Personal history of nicotine dependence: Secondary | ICD-10-CM | POA: Diagnosis not present

## 2016-04-16 DIAGNOSIS — Z7982 Long term (current) use of aspirin: Secondary | ICD-10-CM | POA: Diagnosis not present

## 2016-04-16 LAB — GLUCOSE, CAPILLARY
GLUCOSE-CAPILLARY: 93 mg/dL (ref 65–99)
GLUCOSE-CAPILLARY: 227 mg/dL — AB (ref 65–99)
Glucose-Capillary: 237 mg/dL — ABNORMAL HIGH (ref 65–99)

## 2016-04-16 LAB — LIPID PANEL
CHOLESTEROL: 56 mg/dL (ref 0–200)
HDL: 23 mg/dL — ABNORMAL LOW (ref >40)
LDL Cholesterol: 15 mg/dL (ref 0–99)
TRIGLYCERIDES: 88 mg/dL (ref <150)
Total CHOL/HDL Ratio: 2.4 RATIO
VLDL: 18 mg/dL (ref 0–40)

## 2016-04-16 LAB — ECHOCARDIOGRAM COMPLETE
HEIGHTINCHES: 70 in
Weight: 3792 oz

## 2016-04-16 LAB — HIV ANTIBODY (ROUTINE TESTING W REFLEX): HIV SCREEN 4TH GENERATION: NONREACTIVE

## 2016-04-16 MED ORDER — ENOXAPARIN SODIUM 40 MG/0.4ML ~~LOC~~ SOLN
40.0000 mg | SUBCUTANEOUS | Status: DC
  Administered 2016-04-16: 40 mg via SUBCUTANEOUS
  Filled 2016-04-16: qty 0.4

## 2016-04-16 MED ORDER — INSULIN GLARGINE 100 UNIT/ML ~~LOC~~ SOLN
10.0000 [IU] | Freq: Every day | SUBCUTANEOUS | Status: DC
  Administered 2016-04-16: 10 [IU] via SUBCUTANEOUS
  Filled 2016-04-16 (×2): qty 0.1

## 2016-04-16 MED ORDER — ASPIRIN EC 81 MG PO TBEC
81.0000 mg | DELAYED_RELEASE_TABLET | Freq: Every day | ORAL | Status: DC
  Administered 2016-04-16 – 2016-04-17 (×2): 81 mg via ORAL
  Filled 2016-04-16 (×2): qty 1

## 2016-04-16 NOTE — Evaluation (Signed)
Speech Language Pathology Evaluation Patient Details Name: Cory Carpenter MRN: [id number] DOB: Dec 16, 1960 Today's Date: 04/16/2016 Time: [id number] SLP Time Calculation (min) (ACUTE ONLY): 20 min  Problem List:  Patient Active Problem List   Diagnosis Date Noted  . Stroke-like symptoms 04/15/2016  . Bradycardia 04/15/2016  . Stroke ([place]) 04/15/2016  . Noncompliance with medications   . Intracranial carotid stenosis, right   . CVA (cerebral vascular accident) ([place]) 01/17/2016  . DM (diabetes mellitus), secondary, uncontrolled, w/neurologic complic ([place]) [id number]  . Renal insufficiency 01/17/2016  . Essential hypertension 10/22/2008  . PLEURAL EFFUSION 10/22/2008   Past Medical History:  Past Medical History:  Diagnosis Date  . Diabetes mellitus without complication ([place])   . Glaucoma   . Hypertension   . Narcolepsy   . Sleep apnea   . Stroke [facility])    Past Surgical History: History reviewed. No pertinent surgical history. HPI:  Patient is a 55 y.o. male admitted with slurred speech, and worsening left sided weakness, left facial droop, and slight gait disturbance.  MRI showed acute right pontine infarct.   PMH inclues:  CVA 11/17; DM, Glaucoma, Narcolepsy. Per spouse, he had been getting therapy through the [place], but she did not feel that it was as beneficial or intensive as the therapy he received "at the place on [address]" (seemed to be referring to Outpatient Neuro rehab.   Assessment / Plan / Recommendation Clinical Impression  Patient presents with mild flaccid dysarthria and mild cognitive-linguistic impairment, which are slightly more impaired now as compared to his baseline deficits following CVA in November of 2017. Patient reports that he has trouble thinking and processing information and with his job, he is used to "doing calculations in my head" which he feels he is not able to do well now. Patient and spouse reported that his speech was very dysarthric yesterday, but that  his speech is much better today. SLP did not have any difficulty in understanding patient when he spoke at word, sentence, conversational level and so dyarthria impacts the quality of his speech more than the overall intelligibilty. 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    SLP Assessment  All further Speech Lanaguage Pathology  needs can be addressed in the next venue of care    Follow Up Recommendations  Outpatient SLP    Frequency and Duration     N/A      SLP Evaluation Cognition  Overall Cognitive Status: History of cognitive impairments - at baseline Orientation Level: Oriented X4 Attention: Selective;Alternating Selective Attention: Appears intact Alternating Attention: Impaired Alternating Attention Impairment: Verbal complex Memory: Impaired Memory Impairment: Retrieval deficit Awareness: Appears intact Problem Solving: Impaired Problem Solving Impairment: Verbal complex Safety/Judgment: Impaired (patient was standing in room despite nurse telling him that he was not safe to do so yet.)       Comprehension  Auditory Comprehension Overall Auditory Comprehension: Appears within functional limits for tasks assessed    Expression Expression Primary Mode of Expression: Verbal Verbal Expression Overall Verbal Expression: Appears within functional limits for tasks assessed Non-Verbal Means of Communication: Not applicable Written Expression Dominant Hand: Right   Oral / Motor  Oral Motor/Sensory Function Overall Oral Motor/Sensory Function: Mild impairment Facial ROM: Reduced left Facial Symmetry: Abnormal symmetry left Facial Strength: Reduced left Facial Sensation: Reduced left Lingual ROM: Other (Comment) (reduced up and down movement) Lingual Symmetry: Within Functional Limits Lingual  Strength: Within Functional Limits Lingual  Sensation: Within Functional Limits Velum: Within Functional Limits Mandible: Within Functional Limits Motor Speech Overall Motor Speech: Impaired Phonation: Normal Resonance: Within functional limits Intelligibility: Intelligibility reduced Word: 75-100% accurate Phrase: 75-100% accurate Sentence: 75-100% accurate Conversation: 75-100% accurate Motor Planning: Witnin functional limits Motor Speech Errors: Not applicable Effective Techniques: Slow rate;Increased vocal intensity   GO          Functional Limitations: Memory Motor Speech Current Status [phone]): At least 1 percent but less than 20 percent impaired, limited or restricted Motor Speech Goal Status [phone]): At least 1 percent but less than 20 percent impaired, limited or restricted Motor Speech Goal Status [phone]): At least 1 percent but less than 20 percent impaired, limited or restricted Memory Current Status [id number]): At least 20 percent but less than 40 percent impaired, limited or restricted Memory Goal Status [id number]): At least 20 percent but less than 40 percent impaired, limited or restricted Memory Discharge Status [phone]): At least 20 percent but less than 40 percent impaired, limited or restricted         Dannial Monarch 04/16/2016, 1:35 PM   Dannial Monarch, [place], [place]

## 2016-04-16 NOTE — Progress Notes (Signed)
PROGRESS NOTE    Cory Carpenter  [id number] DOB: [birth year] DOA: 04/15/2016 PCP: [facility]    Brief Narrative:  Cory Carpenter is a very pleasant 56 y.o. male with medical history significant for stroke in November 2017, hypertension, diabetes sent to the emergency Department chief complaint slurred speech worsening facial droop.  Information is obtained from the patient and his wife is at the bedside. Wife reports she noticed intermittent slurred speech over the last 2 days which  became more marketed yesterday. Associated symptoms include worsening left-sided weakness with slight gait disturbance and worsening facial droop. He denies headache dizziness syncope or near-syncope. He denies visual disturbances numbness tingling of the face arms legs. Wife is not sure he is completely compliant with his antihypertensive medications. She states she "sets them up for him" but did not realize that he ran out. Reportedly went to [place] last week and a systolic blood pressure was over 200.   Assessment & Plan:   Principal Problem:   Stroke-like symptoms Active Problems:   Essential hypertension   DM (diabetes mellitus), secondary, uncontrolled, w/neurologic complic (H[facility])   Renal insufficiency   Bradycardia   Stroke [facility])  Acute Right pontine  Infarct;  Worsening slurred speech/facial droop/gait disturbance Doppler; No evidence of significant ICA stenosis. Vertebral artery flow is antegrade. ECHO;  LDL at 15. On Lipitor.  Permissive HTN.  Neurology following.  On plavix and aspirin.  Neurology consulted/   Renal insufficiency. Creatinine 1.10 on admission. This appears to be his baseline.  DM; Hypoglycemia.  Blood sugar increasing.  Continue with SSI.   HTN; permissive HTN.  Resume lisinopril tomorrow.    DVT prophylaxis; lovenox Code Status: Full code.  Family Communication: wife who was at bedside.  Disposition Plan: remain inpatient, stroke work up   Consultants:    Neurology    Procedures: echo  Doppler.    Antimicrobials: none   Subjective: He report improvement of slurred speech   Objective: Vitals:   04/16/16 0306 04/16/16 0435 04/16/16 0625 04/16/16 0949  BP:  (!) 165/74 (!) 155/73 (!) 145/58  Pulse: (!) 49 (!) 51 (!) 56 (!) 55  Resp:  18 17 18   Temp:    98.2 F (36.8 C)  TempSrc:    Oral  SpO2: 97% 95% 94% 98%  Weight:      Height:        Intake/Output Summary (Last 24 hours) at 04/16/16 1126 Last data filed at 04/16/16 0900  Gross per 24 hour  Intake              240 ml  Output                0 ml  Net              240 ml   Filed Weights   04/15/16 1122  Weight: 107.5 kg (237 lb)    Examination:  General exam: Appears calm and comfortable  Respiratory system: Clear to auscultation. Respiratory effort normal. Cardiovascular system: S1 & S2 heard, RRR. No JVD, murmurs, rubs, gallops or clicks. No pedal edema. Gastrointestinal system: Abdomen is nondistended, soft and nontender. No organomegaly or masses felt. Normal bowel sounds heard. Central nervous system: Alert and oriented. Improved dyshartric speech , left side 4/5 right 5/5  Extremities: no edema Skin: No rashes, lesions or ulcers Psychiatry: Judgement and insight appear normal. Mood & affect appropriate.     Data Reviewed: I have personally reviewed following labs and imaging studies  CBC:  Recent Labs Lab 04/15/16 1207 04/15/16 1226  WBC 5.4  --   NEUTROABS 3.0  --   HGB 11.9* 12.2*  HCT 36.3* 36.0*  MCV 91.0  --   PLT 229  --    Basic Metabolic Panel:  Recent Labs Lab 04/15/16 1207 04/15/16 1226  NA 141 143  K 3.9 3.8  CL 106 103  CO2 26  --   GLUCOSE 170* 175*  BUN 16 17  CREATININE 1.22 1.10  CALCIUM 8.9  --    GFR: Estimated Creatinine Clearance: 94.3 mL/min (by C-G formula based on SCr of 1.1 mg/dL). Liver Function Tests:  Recent Labs Lab 04/15/16 1207  AST 17  ALT 14*  ALKPHOS 67  BILITOT 0.7  PROT 6.2*    ALBUMIN 3.5   No results for input(s): LIPASE, AMYLASE in the last 168 hours. No results for input(s): AMMONIA in the last 168 hours. Coagulation Profile:  Recent Labs Lab 04/15/16 1207  INR 1.07   Cardiac Enzymes: No results for input(s): CKTOTAL, CKMB, CKMBINDEX, TROPONINI in the last 168 hours. BNP (last 3 results) No results for input(s): PROBNP in the last 8760 hours. HbA1C: No results for input(s): HGBA1C in the last 72 hours. CBG:  Recent Labs Lab 04/15/16 1704 04/15/16 1957 04/15/16 2122 04/16/16 0627 04/16/16 1102  GLUCAP 89 91 125* 93 237*   Lipid Profile:  Recent Labs  04/16/16 0407  CHOL 56  HDL 23*  LDLCALC 15  TRIG 88  CHOLHDL 2.4   Thyroid Function Tests:  Recent Labs  04/15/16 1459  TSH 0.755   Anemia Panel: No results for input(s): VITAMINB12, FOLATE, FERRITIN, TIBC, IRON, RETICCTPCT in the last 72 hours. Sepsis Labs: No results for input(s): PROCALCITON, LATICACIDVEN in the last 168 hours.  No results found for this or any previous visit (from the past 240 hour(s)).       Radiology Studies: Dg Chest 2 View  Result Date: 04/15/2016 CLINICAL DATA:  Slurred speech with facial droop EXAM: CHEST  2 VIEW COMPARISON:  January 18, 2016 FINDINGS: There is mild scarring in the left lower lobe region. There is no edema or consolidation. Heart size and pulmonary vascularity are normal. No adenopathy. There is evidence of old rib trauma on the left, healed. IMPRESSION: Mild scarring left lower lobe region. No edema or consolidation. Stable cardiac silhouette. Electronically Signed   By: Lowella Grip III M.D.   On: 04/15/2016 14:22   Ct Head Wo Contrast  Result Date: 04/15/2016 CLINICAL DATA:  Slurred speech, left-sided weakness. EXAM: CT HEAD WITHOUT CONTRAST TECHNIQUE: Contiguous axial images were obtained from the base of the skull through the vertex without intravenous contrast. COMPARISON:  CT scan of January 17, 2016. FINDINGS: Brain:  Stable right frontal and parietal encephalomalacia is noted consistent with old infarctions. No mass effect or midline shift is noted. Ventricular size is within normal limits. There is no evidence of mass lesion, hemorrhage or acute infarction. Vascular: Atherosclerosis of carotid siphons is noted. Skull: Normal. Negative for fracture or focal lesion. Sinuses/Orbits: No acute finding. Other: None. IMPRESSION: Old right frontal and parietal infarctions. No acute intracranial abnormality seen. Electronically Signed   By: Marijo Conception, M.D.   On: 04/15/2016 12:01   Mr Brain Wo Contrast  Result Date: 04/15/2016 CLINICAL DATA:  Slurred speech and facial droop EXAM: MRI HEAD WITHOUT CONTRAST MRA HEAD WITHOUT CONTRAST TECHNIQUE: Multiplanar, multiecho pulse sequences of the brain and surrounding structures were obtained without intravenous contrast. Angiographic  images of the head were obtained using MRA technique without contrast. COMPARISON:  Head CT 04/15/2016 FINDINGS: MRI HEAD FINDINGS Brain: There is focal diffusion restriction within the right pons, measuring approximately 9 mm. No other evidence of acute ischemia. No intracranial hemorrhage. There is right MCA distribution encephalomalacia at the site of old infarct. There is ex vacuo dilatation of the right lateral ventricle. No hydrocephalus or extra-axial fluid collection. The midline structures are normal. No age advanced or lobar predominant atrophy. Vascular: Major intracranial arterial and venous sinus flow voids are preserved. No evidence of chronic microhemorrhage or amyloid angiopathy. Skull and upper cervical spine: The visualized skull base, calvarium, upper cervical spine and extracranial soft tissues are normal. Sinuses/Orbits: No fluid levels or advanced mucosal thickening. No mastoid effusion. Normal orbits. MRA HEAD FINDINGS Intracranial internal carotid arteries: There is severe narrowing of the supraclinoid right internal carotid artery.  The left internal carotid artery is normal. Anterior cerebral arteries: Normal. Middle cerebral arteries: There is severe stenosis of the proximal M1 segment of the right middle cerebral artery. The left middle cerebral artery is normal. Posterior communicating arteries: Present bilaterally. Posterior cerebral arteries: Normal. Basilar artery: Normal. Vertebral arteries: Left dominant. Normal. Superior cerebellar arteries: Normal. Anterior inferior cerebellar arteries: Normal. Posterior inferior cerebellar arteries: Normal. IMPRESSION: 1. Acute right pontine infarct without hemorrhage or mass effect. 2. 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 Extensive right MCA distribution encephalomalacia secondary to remote infarct. These results were called by telephone at the time of interpretation on 04/15/2016 at 7:54 pm to Dr. Maudie Mercury, who verbally acknowledged these results. Electronically Signed   By: Ulyses Jarred M.D.   On: 04/15/2016 19:53   [name] Head/brain [id number] Cm  Result Date: 04/15/2016 CLINICAL DATA:  Slurred speech and facial droop EXAM: MRI HEAD WITHOUT CONTRAST MRA HEAD WITHOUT CONTRAST TECHNIQUE: Multiplanar, multiecho pulse sequences of the brain and surrounding structures were obtained without intravenous contrast. Angiographic images of the head were obtained using MRA technique without contrast. COMPARISON:  Head CT 04/15/2016 FINDINGS: MRI HEAD FINDINGS Brain: There is focal diffusion restriction within the right pons, measuring approximately 9 mm. No other evidence of acute ischemia. No intracranial hemorrhage. There is right MCA distribution encephalomalacia at the site of old infarct. There is ex vacuo dilatation of the right lateral ventricle. No hydrocephalus or extra-axial fluid collection. The midline structures are normal. No age advanced or lobar predominant atrophy. Vascular: Major intracranial arterial and venous  sinus flow voids are preserved. No evidence of chronic microhemorrhage or amyloid angiopathy. Skull and upper cervical spine: The visualized skull base, calvarium, upper cervical spine and extracranial soft tissues are normal. Sinuses/Orbits: No fluid levels or advanced mucosal thickening. No mastoid effusion. Normal orbits. MRA HEAD FINDINGS Intracranial internal carotid arteries: There is severe narrowing of the supraclinoid right internal carotid artery. The left internal carotid artery is normal. Anterior cerebral arteries: Normal. Middle cerebral arteries: There is severe stenosis of the proximal M1 segment of the right middle cerebral artery. The left middle cerebral artery is normal. Posterior communicating arteries: Present bilaterally. Posterior cerebral arteries: Normal. Basilar artery: Normal. Vertebral arteries: Left dominant. Normal. Superior cerebellar arteries: Normal. Anterior inferior cerebellar arteries: Normal. Posterior inferior cerebellar arteries: Normal. IMPRESSION: 1. Acute right pontine infarct without hemorrhage or mass effect. 2. 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 Extensive right MCA distribution encephalomalacia secondary to remote infarct. These results were  called by telephone at the time of interpretation on 04/15/2016 at 7:54 pm to Dr. Maudie Mercury, who verbally acknowledged these results. Electronically Signed   By: Ulyses Jarred M.D.   On: 04/15/2016 19:53        Scheduled Meds: . atorvastatin  80 mg Oral q1800  . cholecalciferol  1,000 Units Oral Daily  . clopidogrel  75 mg Oral Daily  . dorzolamide-timolol  1 drop Left Eye BID  . insulin aspart  0-15 Units Subcutaneous TID WC  . insulin aspart  0-5 Units Subcutaneous QHS  . timolol  1 drop Both Eyes Daily   Continuous Infusions:   LOS: 0 days    Time spent: 35 minutes.     Elmarie Shiley, MD [facility] Pager  [phone]  If 7PM-7AM, please contact night-coverage [email or web address] Password [facility] 04/16/2016, 11:26 AM

## 2016-04-16 NOTE — Progress Notes (Signed)
Patient placed on CPAP and tolerating well.

## 2016-04-16 NOTE — Evaluation (Signed)
Occupational Therapy Evaluation Patient Details Name: Cory Carpenter MRN: [id number] DOB: Aug 03, 1960 Today's Date: 04/16/2016    History of Present Illness This 56 y.o. male admitted with slurred speech, and worsening Lt sided weakness, Lt facial droop, and slight gait disturbance.  MRI showed acute Rt pontine infarct.   PMH inclues:  CVA 11/17; DM, Glaucoma, Narcolepsy   Clinical Impression   Pt admitted with above. He demonstrates the below listed deficits and will benefit from continued OT to maximize safety and independence with BADLs.  Pt presents to OT with impaired balance, mild Lt sided weakness, mild cognitive impairment, decreased activity tolerance.  He requires min guard assist - min A for ADLs.   Recommend OPOT follow up and use of tub seat at discharge.  Pt was due to return to work 04/17/16.  Do NOT feel he is yet safe to return to work until cleared by OPOT and PT.     Resting vitals: 176/70, HR 101  After activity    145/58, HR 82 (MD notified)      Follow Up Recommendations  Outpatient OT;Supervision/Assistance - 24 hour    Equipment Recommendations  Tub/shower seat    Recommendations for Other Services       Precautions / Restrictions Precautions Precautions: Fall      Mobility Bed Mobility               General bed mobility comments: up in chair   Transfers Overall transfer level: Needs assistance   Transfers: Sit to/from Stand;Stand Pivot Transfers Sit to Stand: Min guard Stand pivot transfers: Min guard       General transfer comment: for balance     Balance Overall balance assessment: Needs assistance Sitting-balance support: Feet supported Sitting balance-Leahy Scale: Good     Standing balance support: During functional activity Standing balance-Leahy Scale: Fair Standing balance comment: able to maintain static standing with min guard assist, min A for dynamic standing during simulated shower                             ADL Overall ADL's : Needs assistance/impaired Eating/Feeding: Modified independent   Grooming: Wash/dry hands;Wash/dry face;Oral care;Brushing hair;Min guard;Standing   Upper Body Bathing: Set up;Sitting   Lower Body Bathing: Min guard;Sit to/from stand   Upper Body Dressing : Supervision/safety;Sitting   Lower Body Dressing: Min guard;Sit to/from stand   Toilet Transfer: Min guard;Ambulation;Regular Toilet;Grab bars   Toileting- Clothing Manipulation and Hygiene: Min guard;Sit to/from stand   Tub/ Shower Transfer: Min guard;Ambulation   Functional mobility during ADLs: Min guard General ADL Comments: Pt mildly off balance and at risk for falls.  Discussed options for shower seat with pt and wife, and recommend he have direct supervision with transfer.  Also discussed recommendation for OPOT and delay with return to work.  they both verbalized understanding      Vision Vision Assessment?: Yes Ocular Range of Motion: Within Functional Limits Tracking/Visual Pursuits: Able to track stimulus in all quads without difficulty Saccades: Decreased speed of saccadic movement Visual Fields: No apparent deficits Additional Comments: Pt legally blind OS due to glaucoma   Perception Perception Perception Tested?: Yes   Praxis Praxis Praxis tested?: Within functional limits    Pertinent Vitals/Pain Pain Assessment: No/denies pain     Hand Dominance Right   Extremity/Trunk Assessment Upper Extremity Assessment Upper Extremity Assessment: LUE deficits/detail LUE Deficits / Details: grossly 4/5  LUE Coordination: decreased fine motor  Lower Extremity Assessment Lower Extremity Assessment: Defer to PT evaluation   Cervical / Trunk Assessment Cervical / Trunk Assessment: Normal   Communication Communication Communication:  (mildy slurred speech )   Cognition Arousal/Alertness: Awake/alert Behavior During Therapy: WFL for tasks assessed/performed Overall Cognitive  Status: Impaired/Different from baseline Area of Impairment: Problem solving;Safety/judgement         Safety/Judgement: Decreased awareness of safety;Decreased awareness of deficits   Problem Solving: Slow processing;Requires verbal cues General Comments: Pt slow to respond to questions.  He almost walked in front of a bed being transported as pt walked out of his room.    General Comments       Exercises       Shoulder Instructions      Home Living Family/patient expects to be discharged to:: Private residence Living Arrangements: Spouse/significant other Available Help at Discharge: Family;Available 24 hours/day Type of Home: House Home Access: Stairs to enter [employer] of Steps: 3 Entrance Stairs-Rails: None Home Layout: One level     Bathroom Shower/Tub: Tub/shower unit Shower/tub characteristics: Architectural technologist: Standard     Home Equipment: None          Prior Functioning/Environment Level of Independence: Independent        Comments: 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  he was scheduled to return to work on 04/17/16 (has been out of work since previous CVA)        OT Problem List: Decreased strength;Decreased activity tolerance;Impaired balance (sitting and/or standing);Decreased cognition;Decreased safety awareness;Impaired UE functional use   OT Treatment/Interventions: Self-care/ADL training;Therapeutic exercise;Neuromuscular education;Therapeutic activities;Cognitive remediation/compensation;Patient/family education;Balance training    OT Goals(Current goals can be found in the care plan section) Acute Rehab OT Goals Patient Stated Goal: to get back to work  OT Goal Formulation: With patient/family Time For Goal Achievement: 04/30/16 Potential to Achieve Goals: Good ADL Goals Pt Will Perform Grooming: with modified independence;standing Pt Will Perform Upper Body Bathing: with modified independence;sitting Pt  Will Perform Lower Body Bathing: with modified independence;sit to/from stand Pt Will Perform Upper Body Dressing: with modified independence;sitting Pt Will Perform Lower Body Dressing: with modified independence;sit to/from stand Pt Will Transfer to Toilet: with modified independence;ambulating;regular height toilet Pt Will Perform Toileting - Clothing Manipulation and hygiene: with modified independence;sit to/from stand Pt Will Perform Tub/Shower Transfer: Tub transfer;with supervision;ambulating;shower seat Pt/caregiver will Perform Home Exercise Program: Increased strength;Left upper extremity;With written HEP provided;Independently  OT Frequency: Min 2X/week   Barriers to D/C:            Co-evaluation              End of Session Equipment Utilized During Treatment: Gait belt Nurse Communication: Mobility status  Activity Tolerance: Patient tolerated treatment well Patient left: in chair;with call bell/phone within reach;with chair alarm set   Time: 0921-1002 OT Time Calculation (min): 41 min Charges:  OT General Charges $OT Visit: 1 Procedure OT Evaluation $OT Eval Moderate Complexity: 1 Procedure OT Treatments $Self Care/Home Management : 8-22 mins $Therapeutic Activity: 8-22 mins G-Codes: OT G-codes **NOT FOR INPATIENT CLASS** Functional Limitation: Self care Self Care Current Status [id number]): At least 1 percent but less than 20 percent impaired, limited or restricted Self Care Goal Status [id number]): At least 1 percent but less than 20 percent impaired, limited or restricted  Cory Carpenter 04/16/2016, 10:19 AM

## 2016-04-16 NOTE — Progress Notes (Signed)
Patient placed on CPAP without complication. RT will continue to monitor as needed. 

## 2016-04-16 NOTE — Progress Notes (Signed)
   04/16/16 1438 04/16/16 1450  Vitals  BP (!) 173/63 (RN notified) Marland Kitchen 179/72   MD was paged concerning patient BP. MD want to allow permissive hypertension due to patient stroke.

## 2016-04-16 NOTE — Consult Note (Signed)
Reason for Consult: Stroke Referring Physician: Dr. Trudi Ida Lamb is an 56 y.o. male.  HPI: He woke up this am stumbling and with slurred speech.  Arrived in the ER outside of IV tpA window.  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  CT showed the old areas of stroke.  MRI Brain shows an acute right pons infarct.  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  UDS was negative.  LDL was 15 on Lipitor high dose.  He has poorly controlled DM with HgA1c 14.2.   Tele was NSR.  TTE was pending.  His BP is elevated, but he says typically it is well controlled.   Past Medical History:  Diagnosis Date  . Diabetes mellitus without complication ([place])   . Glaucoma   . Hypertension   . Narcolepsy   . Sleep apnea   . Stroke [facility])     History reviewed. No pertinent surgical history.  No family history on file.  Social History:  reports that he has quit smoking. He has never used smokeless tobacco. He reports that he drinks alcohol. He reports that he does not use drugs.  Allergies: No Known Allergies  Prior to Admission medications   Medication Sig Start Date End Date Taking? Authorizing Provider  aspirin EC 81 MG tablet Take 81 mg by mouth daily.   Yes Historical Provider, MD  atorvastatin (LIPITOR) 80 MG tablet Take 1 tablet (80 mg total) by mouth daily at 6 PM. 01/19/16  Yes Lavina Hamman, MD  Brinzolamide-Brimonidine 1-0.2 % SUSP Place 1 drop into both eyes 3 (three) times daily.   Yes Historical Provider, MD  cholecalciferol (VITAMIN D) 1000 units tablet Take 1,000 Units by mouth daily.   Yes Historical Provider, MD  clopidogrel (PLAVIX) 75 MG tablet Take 1 tablet (75 mg total) by mouth daily. 01/20/16  Yes Lavina Hamman, MD  dorzolamide-timolol (COSOPT) 22.3-6.8 MG/ML ophthalmic solution Place 1 drop into the left eye 2 (two) times daily.   Yes Historical Provider,  MD  insulin aspart (NOVOLOG) 100 UNIT/ML injection Inject 10 Units into the skin 3 (three) times daily before meals. 01/19/16  Yes Lavina Hamman, MD  insulin glargine (LANTUS) 100 UNIT/ML injection Inject 50 Units into the skin daily.   Yes Historical Provider, MD  lisinopril (PRINIVIL,ZESTRIL) 40 MG tablet Take 40 mg by mouth daily.   Yes Historical Provider, MD  metFORMIN (GLUCOPHAGE-XR) 500 MG 24 hr tablet Take 1,000 mg by mouth 2 (two) times daily.   Yes Historical Provider, MD  timolol (TIMOPTIC-XR) 0.5 % ophthalmic gel-forming Place 1 drop into both eyes daily.   Yes Historical Provider, MD    Medications:  Scheduled: . aspirin EC  81 mg Oral Daily  . atorvastatin  80 mg Oral q1800  . cholecalciferol  1,000 Units Oral Daily  . clopidogrel  75 mg Oral Daily  . dorzolamide-timolol  1 drop Left Eye BID  . enoxaparin (LOVENOX) injection  40 mg Subcutaneous Q24H  . insulin aspart  0-15 Units Subcutaneous TID WC  . insulin aspart  0-5 Units Subcutaneous QHS  . timolol  1 drop Both Eyes Daily    Results for orders placed or performed during the hospital encounter of 04/15/16 (from the past 48 hour(s))  Protime-INR     Status: None   Collection Time: 04/15/16 12:07 PM  Result  Value Ref Range   Prothrombin Time 14.0 11.4 - 15.2 seconds   INR 1.07   APTT     Status: None   Collection Time: 04/15/16 12:07 PM  Result Value Ref Range   aPTT 33 24 - 36 seconds  CBC     Status: Abnormal   Collection Time: 04/15/16 12:07 PM  Result Value Ref Range   WBC 5.4 4.0 - 10.5 K/uL   RBC 3.99 (L) 4.22 - 5.81 MIL/uL   Hemoglobin 11.9 (L) 13.0 - 17.0 g/dL   HCT 36.3 (L) 39.0 - 52.0 %   MCV 91.0 78.0 - 100.0 fL   MCH 29.8 26.0 - 34.0 pg   MCHC 32.8 30.0 - 36.0 g/dL   RDW 12.1 11.5 - 15.5 %   Platelets 229 150 - 400 K/uL  Differential     Status: None   Collection Time: 04/15/16 12:07 PM  Result Value Ref Range   Neutrophils Relative % 54 %   Neutro Abs 3.0 1.7 - 7.7 K/uL   Lymphocytes  Relative 37 %   Lymphs Abs 2.0 0.7 - 4.0 K/uL   Monocytes Relative 7 %   Monocytes Absolute 0.4 0.1 - 1.0 K/uL   Eosinophils Relative 2 %   Eosinophils Absolute 0.1 0.0 - 0.7 K/uL   Basophils Relative 0 %   Basophils Absolute 0.0 0.0 - 0.1 K/uL  Comprehensive metabolic panel     Status: Abnormal   Collection Time: 04/15/16 12:07 PM  Result Value Ref Range   Sodium 141 135 - 145 mmol/L   Potassium 3.9 3.5 - 5.1 mmol/L   Chloride 106 101 - 111 mmol/L   CO2 26 22 - 32 mmol/L   Glucose, Bld 170 (H) 65 - 99 mg/dL   BUN 16 6 - 20 mg/dL   Creatinine, Ser 1.22 0.61 - 1.24 mg/dL   Calcium 8.9 8.9 - 10.3 mg/dL   Total Protein 6.2 (L) 6.5 - 8.1 g/dL   Albumin 3.5 3.5 - 5.0 g/dL   AST 17 15 - 41 U/L   ALT 14 (L) 17 - 63 U/L   Alkaline Phosphatase 67 38 - 126 U/L   Total Bilirubin 0.7 0.3 - 1.2 mg/dL   GFR calc non Af Amer >60 >60 mL/min   GFR calc Af Amer >60 >60 mL/min    Comment: (NOTE) The eGFR has been calculated using the CKD EPI equation. This calculation has not been validated in all clinical situations. eGFR's persistently <60 mL/min signify possible Chronic Kidney Disease.    Anion gap 9 5 - 15  Ethanol     Status: None   Collection Time: 04/15/16 12:07 PM  Result Value Ref Range   Alcohol, Ethyl (B) <5 <5 mg/dL    Comment:        LOWEST DETECTABLE LIMIT FOR SERUM ALCOHOL IS 5 mg/dL FOR MEDICAL PURPOSES ONLY   I-stat troponin, ED     Status: None   Collection Time: 04/15/16 12:24 PM  Result Value Ref Range   Troponin i, poc 0.01 0.00 - 0.08 ng/mL   Comment 3            Comment: Due to the release kinetics of cTnI, a negative result within the first hours of the onset of symptoms does not rule out myocardial infarction with certainty. If myocardial infarction is still suspected, repeat the test at appropriate intervals.   I-Stat Chem 8, ED     Status: Abnormal   Collection Time: 04/15/16 12:26 PM  Result Value Ref Range   Sodium 143 135 - 145 mmol/L   Potassium  3.8 3.5 - 5.1 mmol/L   Chloride 103 101 - 111 mmol/L   BUN 17 6 - 20 mg/dL   Creatinine, Ser 1.10 0.61 - 1.24 mg/dL   Glucose, Bld 175 (H) 65 - 99 mg/dL   Calcium, Ion 1.20 1.15 - 1.40 mmol/L   TCO2 26 0 - 100 mmol/L   Hemoglobin 12.2 (L) 13.0 - 17.0 g/dL   HCT 36.0 (L) 39.0 - 52.0 %  CBG monitoring, ED     Status: Abnormal   Collection Time: 04/15/16 12:34 PM  Result Value Ref Range   Glucose-Capillary 166 (H) 65 - 99 mg/dL   Comment 1 Notify RN    Comment 2 Document in Chart   TSH     Status: None   Collection Time: 04/15/16  2:59 PM  Result Value Ref Range   TSH 0.755 0.350 - 4.500 uIU/mL    Comment: Performed by a 3rd Generation assay with a functional sensitivity of <=0.01 uIU/mL.  HIV antibody (Routine Testing)     Status: None   Collection Time: 04/15/16  2:59 PM  Result Value Ref Range   HIV Screen 4th Generation wRfx Non Reactive Non Reactive    Comment: (NOTE) Performed At: [facility] [place], [place] [id number] Lindon Romp MD [id number]   Urine rapid drug screen (hosp performed)not at [facility]     Status: None   Collection Time: 04/15/16  3:59 PM  Result Value Ref Range   Opiates NONE DETECTED NONE DETECTED   Cocaine NONE DETECTED NONE DETECTED   Benzodiazepines NONE DETECTED NONE DETECTED   Amphetamines NONE DETECTED NONE DETECTED   Tetrahydrocannabinol NONE DETECTED NONE DETECTED   Barbiturates NONE DETECTED NONE DETECTED    Comment:        DRUG SCREEN FOR MEDICAL PURPOSES ONLY.  IF CONFIRMATION IS NEEDED FOR ANY PURPOSE, NOTIFY LAB WITHIN 5 DAYS.        LOWEST DETECTABLE LIMITS FOR URINE DRUG SCREEN Drug Class       Cutoff (ng/mL) Amphetamine      1000 Barbiturate      200 Benzodiazepine   786 Tricyclics       767 Opiates          300 Cocaine          300 THC              50   Urinalysis, Routine w reflex microscopic     Status: Abnormal   Collection Time: 04/15/16  3:59 PM  Result Value Ref Range   Color, Urine  YELLOW YELLOW   APPearance CLEAR CLEAR   Specific Gravity, Urine 1.018 1.005 - 1.030   pH 5.0 5.0 - 8.0   Glucose, UA NEGATIVE NEGATIVE mg/dL   Hgb urine dipstick NEGATIVE NEGATIVE   Bilirubin Urine NEGATIVE NEGATIVE   Ketones, ur NEGATIVE NEGATIVE mg/dL   Protein, ur 100 (A) NEGATIVE mg/dL   Nitrite NEGATIVE NEGATIVE   Leukocytes, UA NEGATIVE NEGATIVE   RBC / HPF 0-5 0 - 5 RBC/hpf   WBC, UA 0-5 0 - 5 WBC/hpf   Bacteria, UA RARE (A) NONE SEEN   Squamous Epithelial / LPF 0-5 (A) NONE SEEN   Mucous PRESENT    Sperm, UA PRESENT   CBG monitoring, ED     Status: Abnormal   Collection Time: 04/15/16  4:32 PM  Result Value Ref Range  Glucose-Capillary 28 (LL) 65 - 99 mg/dL   Comment 1 Call MD NNP PA CNM   CBG monitoring, ED     Status: None   Collection Time: 04/15/16  5:04 PM  Result Value Ref Range   Glucose-Capillary 89 65 - 99 mg/dL   Comment 1 Notify RN    Comment 2 Document in Chart   Glucose, capillary     Status: None   Collection Time: 04/15/16  7:57 PM  Result Value Ref Range   Glucose-Capillary 91 65 - 99 mg/dL  Glucose, capillary     Status: Abnormal   Collection Time: 04/15/16  9:22 PM  Result Value Ref Range   Glucose-Capillary 125 (H) 65 - 99 mg/dL   Comment 1 Notify RN    Comment 2 Document in Chart   Lipid panel     Status: Abnormal   Collection Time: 04/16/16  4:07 AM  Result Value Ref Range   Cholesterol 56 0 - 200 mg/dL   Triglycerides 88 <150 mg/dL   HDL 23 (L) >40 mg/dL   Total CHOL/HDL Ratio 2.4 RATIO   VLDL 18 0 - 40 mg/dL   LDL Cholesterol 15 0 - 99 mg/dL    Comment:        Total Cholesterol/HDL:CHD Risk Coronary Heart Disease Risk Table                     Men   Women  1/2 Average Risk   3.4   3.3  Average Risk       5.0   4.4  2 X Average Risk   9.6   7.1  3 X Average Risk  23.4   11.0        Use the calculated Patient Ratio above and the CHD Risk Table to determine the patient's CHD Risk.        ATP III CLASSIFICATION (LDL):  <100      mg/dL   Optimal  100-129  mg/dL   Near or Above                    Optimal  130-159  mg/dL   Borderline  160-189  mg/dL   High  >190     mg/dL   Very High   Glucose, capillary     Status: None   Collection Time: 04/16/16  6:27 AM  Result Value Ref Range   Glucose-Capillary 93 65 - 99 mg/dL   Comment 1 Notify RN    Comment 2 Document in Chart   Glucose, capillary     Status: Abnormal   Collection Time: 04/16/16 11:02 AM  Result Value Ref Range   Glucose-Capillary 237 (H) 65 - 99 mg/dL    Dg Chest 2 View  Result Date: 04/15/2016 CLINICAL DATA:  Slurred speech with facial droop EXAM: CHEST  2 VIEW COMPARISON:  January 18, 2016 FINDINGS: There is mild scarring in the left lower lobe region. There is no edema or consolidation. Heart size and pulmonary vascularity are normal. No adenopathy. There is evidence of old rib trauma on the left, healed. IMPRESSION: Mild scarring left lower lobe region. No edema or consolidation. Stable cardiac silhouette. Electronically Signed   By: Lowella Grip III M.D.   On: 04/15/2016 14:22   Ct Head Wo Contrast  Result Date: 04/15/2016 CLINICAL DATA:  Slurred speech, left-sided weakness. EXAM: CT HEAD WITHOUT CONTRAST TECHNIQUE: Contiguous axial images were obtained from the base of the skull through the  vertex without intravenous contrast. COMPARISON:  CT scan of January 17, 2016. FINDINGS: Brain: Stable right frontal and parietal encephalomalacia is noted consistent with old infarctions. No mass effect or midline shift is noted. Ventricular size is within normal limits. There is no evidence of mass lesion, hemorrhage or acute infarction. Vascular: Atherosclerosis of carotid siphons is noted. Skull: Normal. Negative for fracture or focal lesion. Sinuses/Orbits: No acute finding. Other: None. IMPRESSION: Old right frontal and parietal infarctions. No acute intracranial abnormality seen. Electronically Signed   By: Marijo Conception, M.D.   On: 04/15/2016 12:01    Mr Brain Wo Contrast  Result Date: 04/15/2016 CLINICAL DATA:  Slurred speech and facial droop EXAM: MRI HEAD WITHOUT CONTRAST MRA HEAD WITHOUT CONTRAST TECHNIQUE: Multiplanar, multiecho pulse sequences of the brain and surrounding structures were obtained without intravenous contrast. Angiographic images of the head were obtained using MRA technique without contrast. COMPARISON:  Head CT 04/15/2016 FINDINGS: MRI HEAD FINDINGS Brain: There is focal diffusion restriction within the right pons, measuring approximately 9 mm. No other evidence of acute ischemia. No intracranial hemorrhage. There is right MCA distribution encephalomalacia at the site of old infarct. There is ex vacuo dilatation of the right lateral ventricle. No hydrocephalus or extra-axial fluid collection. The midline structures are normal. No age advanced or lobar predominant atrophy. Vascular: Major intracranial arterial and venous sinus flow voids are preserved. No evidence of chronic microhemorrhage or amyloid angiopathy. Skull and upper cervical spine: The visualized skull base, calvarium, upper cervical spine and extracranial soft tissues are normal. Sinuses/Orbits: No fluid levels or advanced mucosal thickening. No mastoid effusion. Normal orbits. MRA HEAD FINDINGS Intracranial internal carotid arteries: There is severe narrowing of the supraclinoid right internal carotid artery. The left internal carotid artery is normal. Anterior cerebral arteries: Normal. Middle cerebral arteries: There is severe stenosis of the proximal M1 segment of the right middle cerebral artery. The left middle cerebral artery is normal. Posterior communicating arteries: Present bilaterally. Posterior cerebral arteries: Normal. Basilar artery: Normal. Vertebral arteries: Left dominant. Normal. Superior cerebellar arteries: Normal. Anterior inferior cerebellar arteries: Normal. Posterior inferior cerebellar arteries: Normal. IMPRESSION: 1. Acute right pontine  infarct without hemorrhage or mass effect. 2. 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 Extensive right MCA distribution encephalomalacia secondary to remote infarct. These results were called by telephone at the time of interpretation on 04/15/2016 at 7:54 pm to Dr. Maudie Mercury, who verbally acknowledged these results. Electronically Signed   By: Ulyses Jarred M.D.   On: 04/15/2016 19:53   [name] Head/brain JO Cm  Result Date: 04/15/2016 CLINICAL DATA:  Slurred speech and facial droop EXAM: MRI HEAD WITHOUT CONTRAST MRA HEAD WITHOUT CONTRAST TECHNIQUE: Multiplanar, multiecho pulse sequences of the brain and surrounding structures were obtained without intravenous contrast. Angiographic images of the head were obtained using MRA technique without contrast. COMPARISON:  Head CT 04/15/2016 FINDINGS: MRI HEAD FINDINGS Brain: There is focal diffusion restriction within the right pons, measuring approximately 9 mm. No other evidence of acute ischemia. No intracranial hemorrhage. There is right MCA distribution encephalomalacia at the site of old infarct. There is ex vacuo dilatation of the right lateral ventricle. No hydrocephalus or extra-axial fluid collection. The midline structures are normal. No age advanced or lobar predominant atrophy. Vascular: Major intracranial arterial and venous sinus flow voids are preserved. No evidence of chronic microhemorrhage or amyloid angiopathy. Skull and upper cervical spine: The visualized skull base, calvarium, upper cervical spine and  extracranial soft tissues are normal. Sinuses/Orbits: No fluid levels or advanced mucosal thickening. No mastoid effusion. Normal orbits. MRA HEAD FINDINGS Intracranial internal carotid arteries: There is severe narrowing of the supraclinoid right internal carotid artery. The left internal carotid artery is normal. Anterior cerebral arteries: Normal. Middle cerebral arteries:  There is severe stenosis of the proximal M1 segment of the right middle cerebral artery. The left middle cerebral artery is normal. Posterior communicating arteries: Present bilaterally. Posterior cerebral arteries: Normal. Basilar artery: Normal. Vertebral arteries: Left dominant. Normal. Superior cerebellar arteries: Normal. Anterior inferior cerebellar arteries: Normal. Posterior inferior cerebellar arteries: Normal. IMPRESSION: 1. Acute right pontine infarct without hemorrhage or mass effect. 2. 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 Extensive right MCA distribution encephalomalacia secondary to remote infarct. These results were called by telephone at the time of interpretation on 04/15/2016 at 7:54 pm to Dr. Maudie Mercury, who verbally acknowledged these results. Electronically Signed   By: Ulyses Jarred M.D.   On: 04/15/2016 19:53    ROS Blood pressure (!) 179/72, pulse (!) 51, temperature 98.9 F (37.2 C), temperature source Oral, resp. rate 18, height 5' 10"  (1.778 m), weight 107.5 kg (237 lb), SpO2 99 %.   Neurologic Examination: Awake, alert, fully oriented. Left lower facial droop. Mild dysarthria. Fluent.  C/N/R - intact Strength 5/5 bilateral UE and LE, but he does have a left UE pronator drift. Sensory is intact to touch. Coord- intact to FTN.   Gait- deferred.    Assessment/Plan:  Acute right pons ischemic infarct with no discernable residual except mild left lower face and arm weakness, both of which may be residuals of prior right frontal infarct too.     He was already on 2 antiplatelets and you can continue that for next 3 months before transitioning to monotherapy.    His cholesterol is extremely well controlled on Lipitor.  However, his diabetes and HTN need to be controlled better.  Permissive hypertension until tomorrow is fine and then start diuretic to drop down slowly.  He is not a smoker.      Rogue Jury, MD 04/16/2016, 3:49 PM

## 2016-04-16 NOTE — Progress Notes (Signed)
PT Cancellation Note  Patient Details Name: Awad Hoglund MRN: [id number] DOB: 08/30/1960   Cancelled Treatment:    Reason Eval/Treat Not Completed: Fatigue/lethargy limiting ability to participate. Pt sleeping. Wife present in room. She prefers to let pt rest and re-attempt PT eval tomorrow.    Lorriane Shire 04/16/2016, 2:06 PM

## 2016-04-17 DIAGNOSIS — I6381 Other cerebral infarction due to occlusion or stenosis of small artery: Secondary | ICD-10-CM

## 2016-04-17 DIAGNOSIS — I639 Cerebral infarction, unspecified: Secondary | ICD-10-CM

## 2016-04-17 LAB — GLUCOSE, CAPILLARY
GLUCOSE-CAPILLARY: 189 mg/dL — AB (ref 65–99)
GLUCOSE-CAPILLARY: 237 mg/dL — AB (ref 65–99)
Glucose-Capillary: 251 mg/dL — ABNORMAL HIGH (ref 65–99)

## 2016-04-17 LAB — HEMOGLOBIN A1C
Hgb A1c MFr Bld: 7.9 % — ABNORMAL HIGH (ref 4.8–5.6)
MEAN PLASMA GLUCOSE: 180 mg/dL

## 2016-04-17 MED ORDER — INSULIN GLARGINE 100 UNIT/ML ~~LOC~~ SOLN: 35.0000 [IU] | Freq: Every day | SUBCUTANEOUS | 11 refills | Status: AC

## 2016-04-17 MED ORDER — LISINOPRIL 20 MG PO TABS
40.0000 mg | ORAL_TABLET | Freq: Every day | ORAL | Status: DC
  Administered 2016-04-17: 40 mg via ORAL
  Filled 2016-04-17: qty 2

## 2016-04-17 NOTE — Discharge Summary (Signed)
Physician Discharge Summary  Cory Carpenter [id number] DOB: [birth year] DOA: 04/15/2016  PCP: [facility]  Admit date: 04/15/2016 Discharge date: 04/17/2016  Admitted From: Home  Disposition:  Home   Recommendations for Outpatient Follow-up:  1. Follow up with PCP in 1-2 weeks 2. Please obtain BMP/CBC in one week 3. risf factors modifications, control of BP   Home Health: no   Discharge Condition: stable.  CODE STATUS: full code.  Diet recommendation: Carb Modified  Brief/Interim Summary: Cory Carpenter a very pleasant 56 y.o.malewith medical history significant for stroke in November 2017, hypertension, diabetes sent to the emergency Department chief complaint slurred speech worsening facial droop.  Information is obtained from the patient and his wife is at the bedside. Wife reports she noticed intermittent slurred speech over the last 2 days which  became more marketed yesterday. Associated symptoms include worsening left-sided weakness with slight gait disturbance and worsening facial droop. He denies headache dizziness syncope or near-syncope. He denies visual disturbances numbness tingling of the face arms legs. Wife is not sure he is completely compliant with his antihypertensive medications. She states she "sets them up for him" but did not realize that he ran out. Reportedly went to [place] last week and a systolic blood pressure was over 200.   Assessment & Plan: Acute Right pontine  Infarct;  Worsening slurred speech/facial droop/gait disturbance on admission  Doppler; No evidence of significant ICA stenosis. Vertebral artery flow is antegrade. ECHO; normal EF, diastolic dysfunction grade 2.  LDL at 15. On Lipitor.  Neurology following. Neurology will arrange out patient holter monitor.  On plavix and aspirin.    Renal insufficiency. Creatinine 1.10 on admission. This appears to be his baseline.  DM; Hypoglycemia.  Blood sugar increasing.  Continue with  SSI.  Resume lantus, discharge on 35 units. Will need further adjustment,   HTN; permissive HTN.  Resume lisinopril today.   Discharge Diagnoses:  Principal Problem:   Stroke-like symptoms Active Problems:   Essential hypertension   DM (diabetes mellitus), secondary, uncontrolled, w/neurologic complic [facility])   Renal insufficiency   Bradycardia   Stroke [facility])    Discharge Instructions  Discharge Instructions    Diet - low sodium heart healthy    Complete by:  As directed    Increase activity slowly    Complete by:  As directed      Allergies as of 04/17/2016   No Known Allergies     Medication List    TAKE these medications   aspirin EC 81 MG tablet Take 81 mg by mouth daily.   atorvastatin 80 MG tablet Commonly known as:  LIPITOR Take 1 tablet (80 mg total) by mouth daily at 6 PM.   Brinzolamide-Brimonidine 1-0.2 % Susp Place 1 drop into both eyes 3 (three) times daily.   cholecalciferol 1000 units tablet Commonly known as:  VITAMIN D Take 1,000 Units by mouth daily.   clopidogrel 75 MG tablet Commonly known as:  PLAVIX Take 1 tablet (75 mg total) by mouth daily.   dorzolamide-timolol 22.3-6.8 MG/ML ophthalmic solution Commonly known as:  COSOPT Place 1 drop into the left eye 2 (two) times daily.   insulin aspart 100 UNIT/ML injection Commonly known as:  novoLOG Inject 10 Units into the skin 3 (three) times daily before meals.   insulin glargine 100 UNIT/ML injection Commonly known as:  LANTUS Inject 0.35 mLs (35 Units total) into the skin daily. What changed:  how much to take   lisinopril 40 MG tablet Commonly  known as:  PRINIVIL,ZESTRIL Take 40 mg by mouth daily.   metFORMIN 500 MG 24 hr tablet Commonly known as:  GLUCOPHAGE-XR Take 1,000 mg by mouth 2 (two) times daily.   timolol 0.5 % ophthalmic gel-forming Commonly known as:  TIMOPTIC-XR Place 1 drop into both eyes daily.      Follow-up Information    [facility] Follow  up.   Contact information: [place] [id number] [phone]        Xu,Jindong, MD Follow up in 3 week(s).   Specialty:  Neurology Contact information: [address] [place] [id number] [phone]          No Known Allergies  Consultations:  Neurology    Procedures/Studies: Dg Chest 2 View  Result Date: 04/15/2016 CLINICAL DATA:  Slurred speech with facial droop EXAM: CHEST  2 VIEW COMPARISON:  January 18, 2016 FINDINGS: There is mild scarring in the left lower lobe region. There is no edema or consolidation. Heart size and pulmonary vascularity are normal. No adenopathy. There is evidence of old rib trauma on the left, healed. IMPRESSION: Mild scarring left lower lobe region. No edema or consolidation. Stable cardiac silhouette. Electronically Signed   By: Lowella Grip III M.D.   On: 04/15/2016 14:22   Ct Head Wo Contrast  Result Date: 04/15/2016 CLINICAL DATA:  Slurred speech, left-sided weakness. EXAM: CT HEAD WITHOUT CONTRAST TECHNIQUE: Contiguous axial images were obtained from the base of the skull through the vertex without intravenous contrast. COMPARISON:  CT scan of January 17, 2016. FINDINGS: Brain: Stable right frontal and parietal encephalomalacia is noted consistent with old infarctions. No mass effect or midline shift is noted. Ventricular size is within normal limits. There is no evidence of mass lesion, hemorrhage or acute infarction. Vascular: Atherosclerosis of carotid siphons is noted. Skull: Normal. Negative for fracture or focal lesion. Sinuses/Orbits: No acute finding. Other: None. IMPRESSION: Old right frontal and parietal infarctions. No acute intracranial abnormality seen. Electronically Signed   By: Marijo Conception, M.D.   On: 04/15/2016 12:01   Mr Brain Wo Contrast  Result Date: 04/15/2016 CLINICAL DATA:  Slurred speech and facial droop EXAM: MRI HEAD WITHOUT CONTRAST MRA HEAD WITHOUT  CONTRAST TECHNIQUE: Multiplanar, multiecho pulse sequences of the brain and surrounding structures were obtained without intravenous contrast. Angiographic images of the head were obtained using MRA technique without contrast. COMPARISON:  Head CT 04/15/2016 FINDINGS: MRI HEAD FINDINGS Brain: There is focal diffusion restriction within the right pons, measuring approximately 9 mm. No other evidence of acute ischemia. No intracranial hemorrhage. There is right MCA distribution encephalomalacia at the site of old infarct. There is ex vacuo dilatation of the right lateral ventricle. No hydrocephalus or extra-axial fluid collection. The midline structures are normal. No age advanced or lobar predominant atrophy. Vascular: Major intracranial arterial and venous sinus flow voids are preserved. No evidence of chronic microhemorrhage or amyloid angiopathy. Skull and upper cervical spine: The visualized skull base, calvarium, upper cervical spine and extracranial soft tissues are normal. Sinuses/Orbits: No fluid levels or advanced mucosal thickening. No mastoid effusion. Normal orbits. MRA HEAD FINDINGS Intracranial internal carotid arteries: There is severe narrowing of the supraclinoid right internal carotid artery. The left internal carotid artery is normal. Anterior cerebral arteries: Normal. Middle cerebral arteries: There is severe stenosis of the proximal M1 segment of the right middle cerebral artery. The left middle cerebral artery is normal. Posterior communicating arteries: Present bilaterally. Posterior cerebral arteries: Normal. Basilar artery:  Normal. Vertebral arteries: Left dominant. Normal. Superior cerebellar arteries: Normal. Anterior inferior cerebellar arteries: Normal. Posterior inferior cerebellar arteries: Normal. IMPRESSION: 1. Acute right pontine infarct without hemorrhage or mass effect. 2. 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 Extensive right MCA distribution encephalomalacia secondary to remote infarct. These results were called by telephone at the time of interpretation on 04/15/2016 at 7:54 pm to Dr. Maudie Mercury, who verbally acknowledged these results. Electronically Signed   By: Ulyses Jarred M.D.   On: 04/15/2016 19:53   [name] Head/brain [id number] Cm  Result Date: 04/15/2016 CLINICAL DATA:  Slurred speech and facial droop EXAM: MRI HEAD WITHOUT CONTRAST MRA HEAD WITHOUT CONTRAST TECHNIQUE: Multiplanar, multiecho pulse sequences of the brain and surrounding structures were obtained without intravenous contrast. Angiographic images of the head were obtained using MRA technique without contrast. COMPARISON:  Head CT 04/15/2016 FINDINGS: MRI HEAD FINDINGS Brain: There is focal diffusion restriction within the right pons, measuring approximately 9 mm. No other evidence of acute ischemia. No intracranial hemorrhage. There is right MCA distribution encephalomalacia at the site of old infarct. There is ex vacuo dilatation of the right lateral ventricle. No hydrocephalus or extra-axial fluid collection. The midline structures are normal. No age advanced or lobar predominant atrophy. Vascular: Major intracranial arterial and venous sinus flow voids are preserved. No evidence of chronic microhemorrhage or amyloid angiopathy. Skull and upper cervical spine: The visualized skull base, calvarium, upper cervical spine and extracranial soft tissues are normal. Sinuses/Orbits: No fluid levels or advanced mucosal thickening. No mastoid effusion. Normal orbits. MRA HEAD FINDINGS Intracranial internal carotid arteries: There is severe narrowing of the supraclinoid right internal carotid artery. The left internal carotid artery is normal. Anterior cerebral arteries: Normal. Middle cerebral arteries: There is severe stenosis of the proximal M1 segment of the right middle cerebral artery. The left middle cerebral artery is normal. Posterior communicating  arteries: Present bilaterally. Posterior cerebral arteries: Normal. Basilar artery: Normal. Vertebral arteries: Left dominant. Normal. Superior cerebellar arteries: Normal. Anterior inferior cerebellar arteries: Normal. Posterior inferior cerebellar arteries: Normal. IMPRESSION: 1. Acute right pontine infarct without hemorrhage or mass effect. 2. 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 Extensive right MCA distribution encephalomalacia secondary to remote infarct. These results were called by telephone at the time of interpretation on 04/15/2016 at 7:54 pm to Dr. Maudie Mercury, who verbally acknowledged these results. Electronically Signed   By: Ulyses Jarred M.D.   On: 04/15/2016 19:53       Subjective: He is feeling better, feels condition has improved   Discharge Exam: Vitals:   04/16/16 2221 04/17/16 0922  BP:  (!) 146/67  Pulse: (!) 58 (!) 54  Resp: 16 18  Temp:  98.4 F (36.9 C)   Vitals:   04/16/16 1450 04/16/16 1730 04/16/16 2221 04/17/16 0922  BP: (!) 179/72 (!) 182/83  (!) 146/67  Pulse:  (!) 54 (!) 58 (!) 54  Resp:  18 16 18   Temp:  98.4 F (36.9 C)  98.4 F (36.9 C)  TempSrc:  Oral  Oral  SpO2:   99% 97%  Weight:      Height:        General: Pt is alert, awake, not in acute distress Cardiovascular: RRR, S1/S2 +, no rubs, no gallops Respiratory: CTA bilaterally, no wheezing, no rhonchi Abdominal: Soft, NT, ND, bowel sounds + Extremities: no edema, no cyanosis Neuro; improvement of slurred  speech, left side weakness stable.     The results of significant diagnostics from this hospitalization (including imaging, microbiology, ancillary and laboratory) are listed below for reference.     Microbiology: No results found for this or any previous visit (from the past 240 hour(s)).   Labs: BNP (last 3 results) No results for input(s): BNP in the last 8760 hours. Basic Metabolic Panel:  Recent  Labs Lab 04/15/16 1207 04/15/16 1226  NA 141 143  K 3.9 3.8  CL 106 103  CO2 26  --   GLUCOSE 170* 175*  BUN 16 17  CREATININE 1.22 1.10  CALCIUM 8.9  --    Liver Function Tests:  Recent Labs Lab 04/15/16 1207  AST 17  ALT 14*  ALKPHOS 67  BILITOT 0.7  PROT 6.2*  ALBUMIN 3.5   No results for input(s): LIPASE, AMYLASE in the last 168 hours. No results for input(s): AMMONIA in the last 168 hours. CBC:  Recent Labs Lab 04/15/16 1207 04/15/16 1226  WBC 5.4  --   NEUTROABS 3.0  --   HGB 11.9* 12.2*  HCT 36.3* 36.0*  MCV 91.0  --   PLT 229  --    Cardiac Enzymes: No results for input(s): CKTOTAL, CKMB, CKMBINDEX, TROPONINI in the last 168 hours. BNP: Invalid input(s): POCBNP CBG:  Recent Labs Lab 04/16/16 1102 04/16/16 1643 04/16/16 2103 04/17/16 0601 04/17/16 1108  GLUCAP 237* 227* 251* 189* 237*   D-Dimer No results for input(s): DDIMER in the last 72 hours. Hgb A1c  Recent Labs  04/16/16 0407  HGBA1C 7.9*   Lipid Profile  Recent Labs  04/16/16 0407  CHOL 56  HDL 23*  LDLCALC 15  TRIG 88  CHOLHDL 2.4   Thyroid function studies  Recent Labs  04/15/16 1459  TSH 0.755   Anemia work up No results for input(s): VITAMINB12, FOLATE, FERRITIN, TIBC, IRON, RETICCTPCT in the last 72 hours. Urinalysis    Component Value Date/Time   COLORURINE YELLOW 04/15/2016 1559   APPEARANCEUR CLEAR 04/15/2016 1559   LABSPEC 1.018 04/15/2016 1559   PHURINE 5.0 04/15/2016 1559   GLUCOSEU NEGATIVE 04/15/2016 1559   HGBUR NEGATIVE 04/15/2016 1559   BILIRUBINUR NEGATIVE 04/15/2016 1559   KETONESUR NEGATIVE 04/15/2016 1559   PROTEINUR 100 (A) 04/15/2016 1559   UROBILINOGEN 0.2 02/16/2010 1154   NITRITE NEGATIVE 04/15/2016 1559   LEUKOCYTESUR NEGATIVE 04/15/2016 1559   Sepsis Labs Invalid input(s): PROCALCITONIN,  WBC,  LACTICIDVEN Microbiology No results found for this or any previous visit (from the past 240 hour(s)).   Time coordinating  discharge: Over 30 minutes  SIGNED:   Elmarie Shiley, MD  [facility] 04/17/2016, 1:30 PM Pager   If 7PM-7AM, please contact night-coverage [email or web address] Password [email or web address]

## 2016-04-17 NOTE — Progress Notes (Signed)
STROKE TEAM PROGRESS NOTE   HISTORY OF PRESENT ILLNESS (per record) Cory Carpenter is a 56 y.o. male who woke up this am stumbling and with slurred speech.  Arrived in the ER outside of IV tpA window.  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  CT showed the old areas of stroke.  MRI Brain shows an acute right pons infarct.  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  UDS was negative.  LDL was 15 on Lipitor high dose. He has poorly controlled DM with HgA1c 14.2.   Tele was NSR.  TTE was pending.  His BP is elevated, but he says typically it is well controlled.    SUBJECTIVE (INTERVAL HISTORY) His wife is not at bedside but spoke to her over the phone. He was recently hospitalized in November for stroke seen by Dr. Erlinda Hong, strokes due to extensive atherosclerotic disease (HgbA1c in the past > 14)   OBJECTIVE Temp:  [98.4 F (36.9 C)-98.9 F (37.2 C)] 98.4 F (36.9 C) (02/18 0922) Pulse Rate:  [51-58] 54 (02/18 0922) Cardiac Rhythm: Sinus bradycardia (02/18 1230) Resp:  [16-18] 18 (02/18 0922) BP: (146-182)/(63-83) 146/67 (02/18 0922) SpO2:  [97 %-99 %] 97 % (02/18 0922)  CBC:   Recent Labs Lab 04/15/16 1207 04/15/16 1226  WBC 5.4  --   NEUTROABS 3.0  --   HGB 11.9* 12.2*  HCT 36.3* 36.0*  MCV 91.0  --   PLT 229  --     Basic Metabolic Panel:   Recent Labs Lab 04/15/16 1207 04/15/16 1226  NA 141 143  K 3.9 3.8  CL 106 103  CO2 26  --   GLUCOSE 170* 175*  BUN 16 17  CREATININE 1.22 1.10  CALCIUM 8.9  --     Lipid Panel:     Component Value Date/Time   CHOL 56 04/16/2016 0407   TRIG 88 04/16/2016 0407   HDL 23 (L) 04/16/2016 0407   CHOLHDL 2.4 04/16/2016 0407   VLDL 18 04/16/2016 0407   LDLCALC 15 04/16/2016 0407   HgbA1c:  Lab Results  Component Value Date   HGBA1C 7.9 (H) 04/16/2016   Urine Drug Screen:     Component Value Date/Time    LABOPIA NONE DETECTED 04/15/2016 1559   COCAINSCRNUR NONE DETECTED 04/15/2016 1559   LABBENZ NONE DETECTED 04/15/2016 1559   AMPHETMU NONE DETECTED 04/15/2016 1559   THCU NONE DETECTED 04/15/2016 1559   LABBARB NONE DETECTED 04/15/2016 1559      IMAGING  Dg Chest 2 View 04/15/2016 Mild scarring left lower lobe region. No edema or consolidation. Stable cardiac silhouette.     Mr Cory Carpenter Head/brain Wo Cm 04/15/2016 1. Acute right pontine infarct without hemorrhage or mass effect.  2. 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 Extensive right MCA distribution encephalomalacia secondary to remote infarct.     Transthoracic echocardiogram  04/16/2016 Study Conclusions - Left ventricle: The cavity size was normal. Wall thickness was   increased in a pattern of mild LVH. Systolic function was normal.   The estimated ejection fraction was in the range of 60% to 65%.   Wall motion was normal; there were no regional wall motion   abnormalities. Features are consistent with a pseudonormal left   ventricular filling pattern, with concomitant abnormal relaxation   and increased  filling pressure (grade 2 diastolic dysfunction). - Aortic valve: There was no stenosis. There was trivial   regurgitation. - Mitral valve: There was mild regurgitation. - Right ventricle: The cavity size was normal. Systolic function   was normal. - Pulmonary arteries: PA peak pressure: 30 mm Hg (S). - Inferior vena cava: The vessel was normal in size. The   respirophasic diameter changes were in the normal range (>= 50%),   consistent with normal central venous pressure. Impressions: - Normal LV size with mild LV hypertrophy. EF 60-65%. Moderate   diastolic dysfunction. Normal RV size and systolic function. Mild   mitral regurgitation.  Physical exam: Exam: Gen: NAD, conversant, obese                CV: RRR, no MRG. No Carotid  Bruits. No peripheral edema, warm, nontender Eyes: Conjunctivae clear without exudates or hemorrhage  Neuro: Detailed Neurologic Exam  Speech:    Speech is normal; fluent and spontaneous with normal comprehension.  Cognition:    The patient is oriented to person, place, and time;  Cranial Nerves:    The pupils are equal, round, and reactive to light. Impaired left eye vision and cannot count fingers but can see shadows. Extraocular movements are intact. Trigeminal sensation is intact and the muscles of mastication are normal. Left facial droop The palate elevates in the midline. Hearing intact. Voice is normal. Shoulder shrug is normal. The tongue has normal motion without fasciculations.   Coordination:    Normal finger to nose   Motor Observation:    No asymmetry, no atrophy, and no involuntary movements noted. Tone:    Normal muscle tone.    Posture:    Posture is normal. normal erect    Strength:    Strength is V/V in the upper and lower limbs.      Sensation: intact to LT     Reflex Exam:  DTR's:    Absent AJs        Clonus is absent.      ASSESSMENT/PLAN [name] is a 56 y.o. male with history of previous strokes, obstructive sleep apnea, narcolepsy, hypertension, and diabetes mellitus presenting with stumbling and slurred speech. He did not receive IV t-PA due to unknown time of onset.  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  Resultant  Left facial droop (chronic)  MRI - Acute right pontine infarct  MRA - Severe narrowing of the supraclinoid Rt ICA and proximal Rt MCA.  Carotid Doppler - Bilateral - No evidence of significant ICA stenosis. Vertebral artery flow is antegrade.  2D Echo - EF 60-65%. No cardiac source of emboli identified.   LDL - 15  HgbA1c - 7.9  VTE prophylaxis - Lovenox  Diet heart healthy/carb modified Room service appropriate? Yes; Fluid consistency: Thin Diet - low sodium heart  healthy  aspirin 325 mg daily and clopidogrel 75 mg daily prior to admission, now on aspirin 81 mg daily and clopidogrel 75 mg daily continue for 3 months then Plavix alone  30-day heart monitor outpatient to assess for afib as cause of emboli vs severe diffuse atheroscleroma  Patient counseled to be compliant with his antithrombotic medications  Ongoing aggressive stroke risk factor management  Therapy recommendations: Outpatient PT and OT recommended.   Disposition: Pending  Hypertension  Stable  Permissive hypertension (OK if < 220/120) but gradually normalize in 5-7 days  Long-term BP goal normotensive  Hyperlipidemia  Home meds: Lipitor 80 mg daily resumed in  hospital  LDL 15, goal < 70  Continue statin at discharge  Diabetes  HgbA1c 7.9, goal < 7.0  Uncontrolled  Other Stroke Risk Factors  Former cigarette smoker - has since quit.  ETOH use, advised to drink no more than 1 - 2 drink(s) a day  Obesity, Body mass index is 34.01 kg/m., recommend weight loss, diet and exercise as appropriate   Hx stroke/TIA  Obstructive sleep apnea..   Personally examined patient and images, and have participated in and made any corrections needed to history, physical, neuro exam,assessment and plan as stated above.  I have personally obtained the history, evaluated lab date, reviewed imaging studies and agree with radiology interpretations.   Neurology will sign off at this time.  Sarina Ill, MD Stroke Neurology [facility]    To contact Stroke Continuity provider, please refer to [email or web address] After hours, contact General Neurology

## 2016-04-17 NOTE — Evaluation (Signed)
Physical Therapy Evaluation Patient Details Name: Cory Carpenter MRN: [id number] DOB: [birth year] Today's Date: 04/17/2016   History of Present Illness  This 56 y.o. male admitted with slurred speech, and worsening Lt sided weakness, Lt facial droop, and slight gait disturbance.  MRI showed acute Rt pontine infarct.   PMH inclues:  CVA 11/17; DM, Glaucoma, Narcolepsy  Clinical Impression  Pt admitted with above diagnosis. Pt currently with functional limitations due to the deficits listed below (see PT Problem List). On eval, pt required supervision for transfers and ambulation without AD 350 feet. Pt will benefit from skilled PT to increase their independence and safety with mobility to allow discharge to the venue listed below.       Follow Up Recommendations Outpatient PT;Supervision - Intermittent    Equipment Recommendations  None recommended by PT    Recommendations for Other Services       Precautions / Restrictions Precautions Precautions: Fall      Mobility  Bed Mobility Overal bed mobility: Independent                Transfers   Equipment used: None   Sit to Stand: Supervision Stand pivot transfers: Supervision       General transfer comment: supervision for safety only, no physical assist  Ambulation/Gait Ambulation/Gait assistance: Supervision Ambulation Distance (Feet): 350 Feet Assistive device: None Gait Pattern/deviations: Step-through pattern;Drifts right/left Gait velocity: WFL      Stairs            Wheelchair Mobility    Modified Rankin (Stroke Patients Only) Modified Rankin (Stroke Patients Only) Pre-Morbid Rankin Score: No significant disability Modified Rankin: Slight disability     Balance   Sitting-balance support: Feet supported;No upper extremity supported Sitting balance-Leahy Scale: Good     Standing balance support: During functional activity;No upper extremity supported Standing balance-Leahy Scale:  Fair Standing balance comment: No LOB during ambulation with head turns right/left and up/down.                             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  he was scheduled to return to work on 04/17/16 (has been out of work since previous CVA)     Hand Dominance   Dominant Hand: Right    Extremity/Trunk Assessment   Upper Extremity Assessment Upper Extremity Assessment: Defer to OT evaluation    Lower Extremity Assessment Lower Extremity Assessment: LLE deficits/detail (strength appears symmetrical) LLE Deficits / Details: grossly 4/5, with RLE being 5/5    Cervical / Trunk Assessment Cervical / Trunk Assessment: Normal  Communication   Communication: No difficulties  Cognition Arousal/Alertness: Awake/alert Behavior During Therapy: WFL for tasks assessed/performed Overall Cognitive Status: Impaired/Different from baseline Area of Impairment: Problem solving;Safety/judgement         Safety/Judgement: Decreased awareness of safety;Decreased awareness of deficits   Problem Solving: Slow processing      General Comments      Exercises     Assessment/Plan    PT Assessment  Patient needs continued PT services  PT Problem List Decreased strength;Decreased activity tolerance;Decreased balance;Decreased cognition;Decreased mobility;Decreased safety awareness          PT Treatment Interventions Gait training;Stair training;Functional mobility training;Balance  training;Patient/family education;Therapeutic activities;Therapeutic exercise;Cognitive remediation    PT Goals (Current goals can be found in the Care Plan section)  Acute Rehab PT Goals Patient Stated Goal: to get back to work  PT Goal Formulation: With patient Time For Goal Achievement: 05/01/16 Potential to Achieve Goals: Good    Frequency Min 4X/week   Barriers to discharge        Co-evaluation               End of Session Equipment Utilized During Treatment: Gait belt Activity Tolerance: Patient tolerated treatment well Patient left: in bed;with call bell/phone within reach Nurse Communication: Mobility status         Time: [id number] PT Time Calculation (min) (ACUTE ONLY): 16 min   Charges:   PT Evaluation $PT Eval Moderate Complexity: 1 Procedure     PT G Codes:        Lorriane Shire 04/17/2016, 9:28 AM

## 2016-04-20 LAB — VAS US CAROTID
LCCAPSYS: 114 cm/s
LEFT ECA DIAS: -15 cm/s
LEFT VERTEBRAL DIAS: -18 cm/s
LICADDIAS: -15 cm/s
LICAPDIAS: -14 cm/s
LICAPSYS: -72 cm/s
Left CCA dist dias: -15 cm/s
Left CCA dist sys: -102 cm/s
Left CCA prox dias: 18 cm/s
Left ICA dist sys: -67 cm/s
RIGHT ECA DIAS: -4 cm/s
RIGHT VERTEBRAL DIAS: -11 cm/s
Right CCA prox dias: 6 cm/s
Right CCA prox sys: 122 cm/s
Right cca dist sys: -39 cm/s

## 2017-11-23 IMAGING — DX DG CHEST 2V
2 series · 2 of 2 positions shown · non-contrast
Comparison: January 18, 2016

CLINICAL DATA: Slurred speech with facial droop

EXAM:
CHEST  2 VIEW

[chest pa]
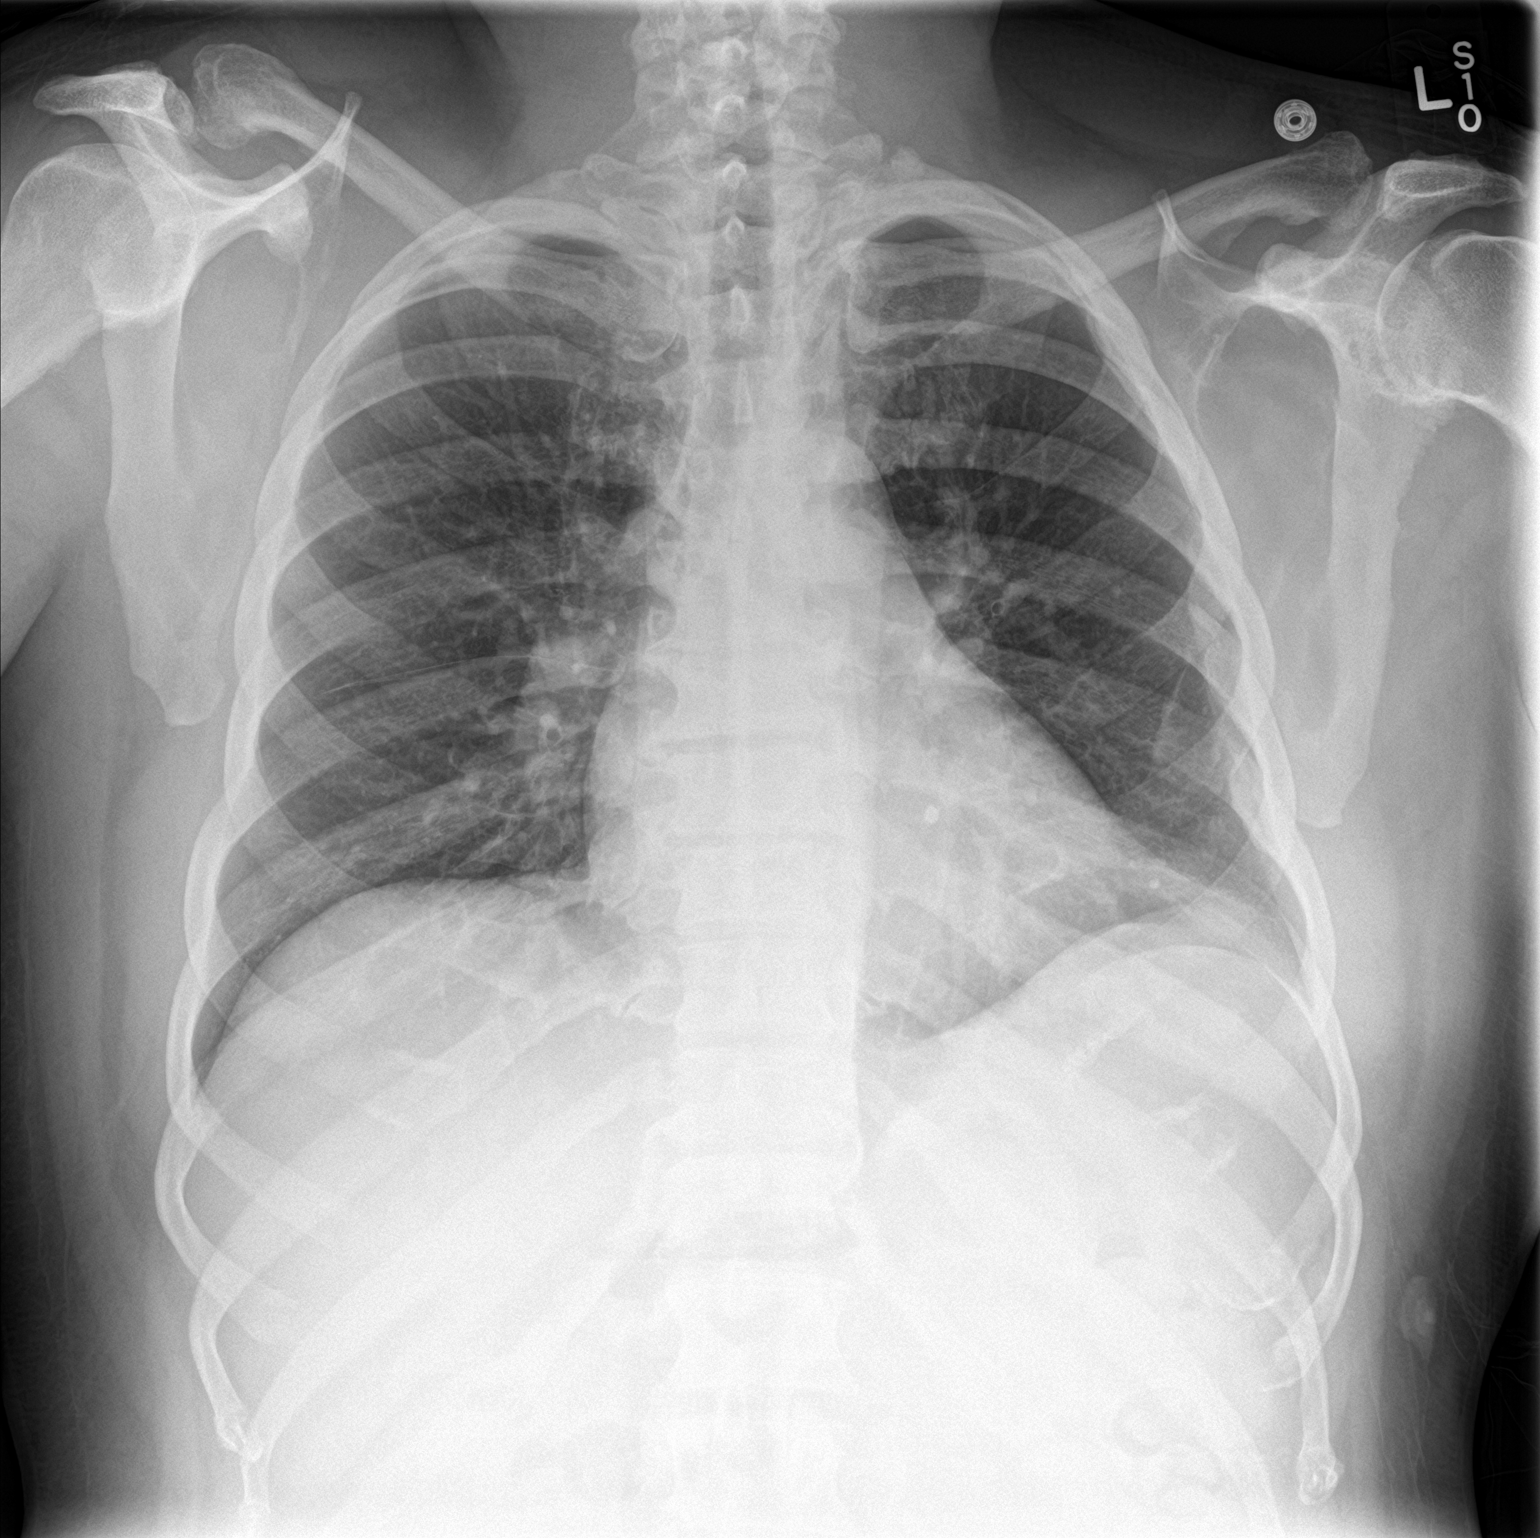

[chest lat]
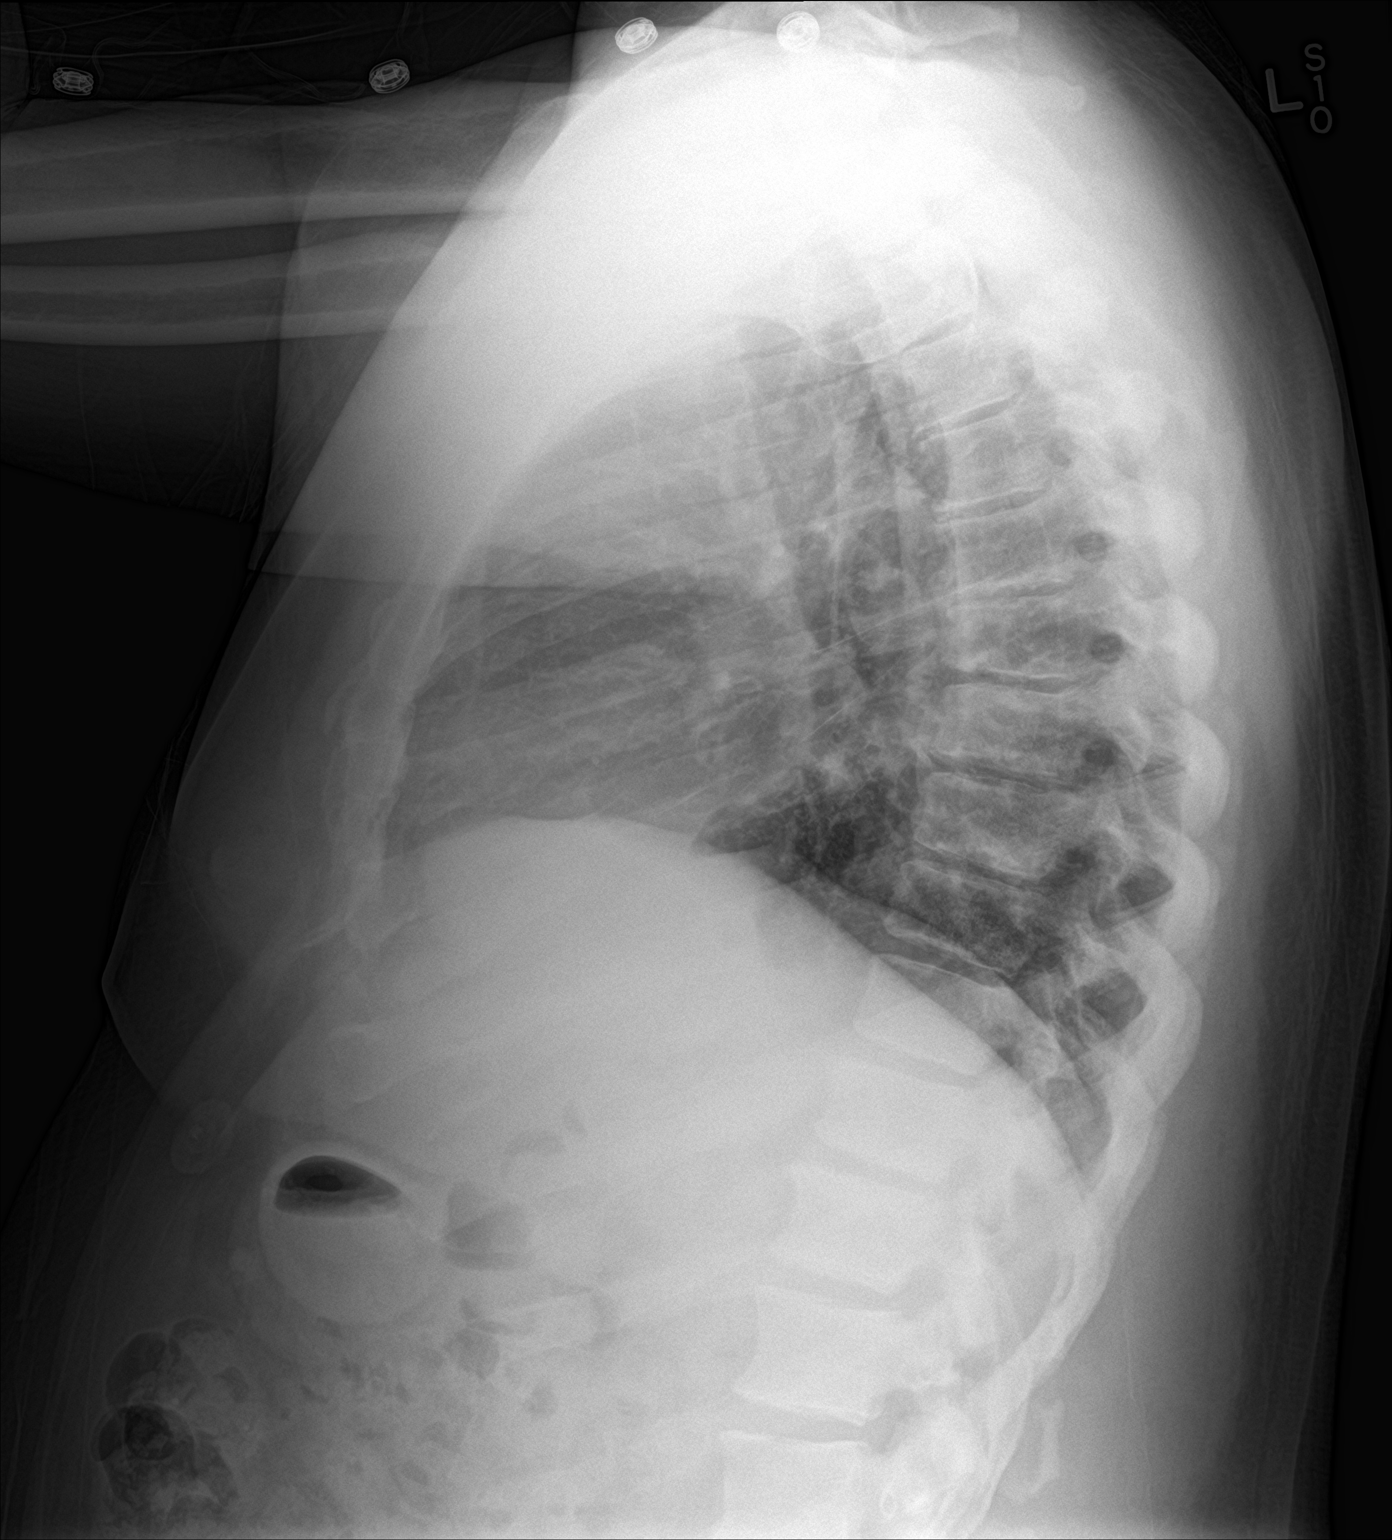

[2 of 2 positions shown; findings below may reference images not displayed]

FINDINGS: There is mild scarring in the left lower lobe region. There is no
edema or consolidation. Heart size and pulmonary vascularity are
normal. No adenopathy. There is evidence of old rib trauma on the
left, healed.
IMPRESSION: Mild scarring left lower lobe region. No edema or consolidation.
Stable cardiac silhouette.

## 2018-01-18 ENCOUNTER — Encounter (HOSPITAL_COMMUNITY): Payer: Self-pay

## 2018-01-18 ENCOUNTER — Inpatient Hospital Stay (HOSPITAL_COMMUNITY)
Admission: EM | Admit: 2018-01-18 | Discharge: 2018-01-22 | DRG: 682 | Disposition: A | Discharge status: Home / Self Care | Payer: Federal, State, Local not specified - PPO | Attending: Internal Medicine | Admitting: Internal Medicine

## 2018-01-18 ENCOUNTER — Emergency Department (HOSPITAL_COMMUNITY): Payer: Federal, State, Local not specified - PPO

## 2018-01-18 DIAGNOSIS — R270 Ataxia, unspecified: Secondary | ICD-10-CM

## 2018-01-18 DIAGNOSIS — N178 Other acute kidney failure: Secondary | ICD-10-CM | POA: Diagnosis not present

## 2018-01-18 DIAGNOSIS — Z823 Family history of stroke: Secondary | ICD-10-CM | POA: Diagnosis not present

## 2018-01-18 DIAGNOSIS — R296 Repeated falls: Secondary | ICD-10-CM | POA: Diagnosis present

## 2018-01-18 DIAGNOSIS — G4730 Sleep apnea, unspecified: Secondary | ICD-10-CM | POA: Diagnosis present

## 2018-01-18 DIAGNOSIS — R197 Diarrhea, unspecified: Secondary | ICD-10-CM | POA: Diagnosis not present

## 2018-01-18 DIAGNOSIS — E1365 Other specified diabetes mellitus with hyperglycemia: Secondary | ICD-10-CM

## 2018-01-18 DIAGNOSIS — R112 Nausea with vomiting, unspecified: Secondary | ICD-10-CM | POA: Diagnosis present

## 2018-01-18 DIAGNOSIS — R29701 NIHSS score 1: Secondary | ICD-10-CM | POA: Diagnosis present

## 2018-01-18 DIAGNOSIS — E860 Dehydration: Secondary | ICD-10-CM | POA: Diagnosis present

## 2018-01-18 DIAGNOSIS — I63511 Cerebral infarction due to unspecified occlusion or stenosis of right middle cerebral artery: Secondary | ICD-10-CM | POA: Diagnosis present

## 2018-01-18 DIAGNOSIS — I6330 Cerebral infarction due to thrombosis of unspecified cerebral artery: Secondary | ICD-10-CM

## 2018-01-18 DIAGNOSIS — Z87891 Personal history of nicotine dependence: Secondary | ICD-10-CM | POA: Diagnosis not present

## 2018-01-18 DIAGNOSIS — R066 Hiccough: Secondary | ICD-10-CM | POA: Diagnosis present

## 2018-01-18 DIAGNOSIS — Z833 Family history of diabetes mellitus: Secondary | ICD-10-CM | POA: Diagnosis not present

## 2018-01-18 DIAGNOSIS — I69393 Ataxia following cerebral infarction: Secondary | ICD-10-CM | POA: Diagnosis not present

## 2018-01-18 DIAGNOSIS — E1349 Other specified diabetes mellitus with other diabetic neurological complication: Secondary | ICD-10-CM | POA: Diagnosis present

## 2018-01-18 DIAGNOSIS — Z9989 Dependence on other enabling machines and devices: Secondary | ICD-10-CM

## 2018-01-18 DIAGNOSIS — N179 Acute kidney failure, unspecified: Principal | ICD-10-CM | POA: Diagnosis present

## 2018-01-18 DIAGNOSIS — Z7902 Long term (current) use of antithrombotics/antiplatelets: Secondary | ICD-10-CM | POA: Diagnosis not present

## 2018-01-18 DIAGNOSIS — H409 Unspecified glaucoma: Secondary | ICD-10-CM | POA: Diagnosis present

## 2018-01-18 DIAGNOSIS — Z841 Family history of disorders of kidney and ureter: Secondary | ICD-10-CM

## 2018-01-18 DIAGNOSIS — Z79899 Other long term (current) drug therapy: Secondary | ICD-10-CM | POA: Diagnosis not present

## 2018-01-18 DIAGNOSIS — IMO0002 Reserved for concepts with insufficient information to code with codable children: Secondary | ICD-10-CM | POA: Diagnosis present

## 2018-01-18 DIAGNOSIS — Z8673 Personal history of transient ischemic attack (TIA), and cerebral infarction without residual deficits: Secondary | ICD-10-CM

## 2018-01-18 DIAGNOSIS — Z794 Long term (current) use of insulin: Secondary | ICD-10-CM

## 2018-01-18 DIAGNOSIS — Z7982 Long term (current) use of aspirin: Secondary | ICD-10-CM

## 2018-01-18 DIAGNOSIS — T383X6A Underdosing of insulin and oral hypoglycemic [antidiabetic] drugs, initial encounter: Secondary | ICD-10-CM | POA: Diagnosis present

## 2018-01-18 DIAGNOSIS — G47419 Narcolepsy without cataplexy: Secondary | ICD-10-CM | POA: Diagnosis present

## 2018-01-18 DIAGNOSIS — I639 Cerebral infarction, unspecified: Secondary | ICD-10-CM | POA: Diagnosis not present

## 2018-01-18 DIAGNOSIS — E1151 Type 2 diabetes mellitus with diabetic peripheral angiopathy without gangrene: Secondary | ICD-10-CM | POA: Diagnosis present

## 2018-01-18 DIAGNOSIS — Z9181 History of falling: Secondary | ICD-10-CM

## 2018-01-18 DIAGNOSIS — I10 Essential (primary) hypertension: Secondary | ICD-10-CM | POA: Diagnosis not present

## 2018-01-18 LAB — URINALYSIS, ROUTINE W REFLEX MICROSCOPIC
BILIRUBIN URINE: NEGATIVE
Glucose, UA: >=500 mg/dL — AB
Hgb urine dipstick: NEGATIVE
KETONES UR: NEGATIVE mg/dL
Leukocytes, UA: NEGATIVE
Nitrite: NEGATIVE
Protein, ur: 30 mg/dL — AB
Specific Gravity, Urine: 1.012 (ref 1.005–1.030)
pH: 5 (ref 5.0–8.0)

## 2018-01-18 LAB — CBC WITH DIFFERENTIAL/PLATELET
ABS IMMATURE GRANULOCYTES: 0.01 10*3/uL (ref 0.00–0.07)
BASOS ABS: 0 10*3/uL (ref 0.0–0.1)
BASOS PCT: 0 %
Eosinophils Absolute: 0.1 10*3/uL (ref 0.0–0.5)
Eosinophils Relative: 1 %
HCT: 40.2 % (ref 39.0–52.0)
Hemoglobin: 13.3 g/dL (ref 13.0–17.0)
IMMATURE GRANULOCYTES: 0 %
Lymphocytes Relative: 27 %
Lymphs Abs: 2 10*3/uL (ref 0.7–4.0)
MCH: 30.4 pg (ref 26.0–34.0)
MCHC: 33.1 g/dL (ref 30.0–36.0)
MCV: 91.8 fL (ref 80.0–100.0)
MONOS PCT: 9 %
Monocytes Absolute: 0.6 10*3/uL (ref 0.1–1.0)
NEUTROS ABS: 4.6 10*3/uL (ref 1.7–7.7)
NEUTROS PCT: 63 %
NRBC: 0 % (ref 0.0–0.2)
Platelets: 273 10*3/uL (ref 150–400)
RBC: 4.38 MIL/uL (ref 4.22–5.81)
RDW: 11.2 % — AB (ref 11.5–15.5)
WBC: 7.3 10*3/uL (ref 4.0–10.5)

## 2018-01-18 LAB — I-STAT VENOUS BLOOD GAS, ED
Acid-Base Excess: 7 mmol/L — ABNORMAL HIGH (ref 0.0–2.0)
BICARBONATE: 34.4 mmol/L — AB (ref 20.0–28.0)
O2 Saturation: 33 %
PCO2 VEN: 62.2 mmHg — AB (ref 44.0–60.0)
TCO2: 36 mmol/L — AB (ref 22–32)
pH, Ven: 7.35 (ref 7.250–7.430)
pO2, Ven: 22 mmHg — CL (ref 32.0–45.0)

## 2018-01-18 LAB — COMPREHENSIVE METABOLIC PANEL
ALBUMIN: 4.1 g/dL (ref 3.5–5.0)
ALK PHOS: 75 U/L (ref 38–126)
ALT: 12 U/L (ref 0–44)
ANION GAP: 20 — AB (ref 5–15)
AST: 22 U/L (ref 15–41)
BUN: 84 mg/dL — ABNORMAL HIGH (ref 6–20)
CALCIUM: 9.2 mg/dL (ref 8.9–10.3)
CO2: 24 mmol/L (ref 22–32)
Chloride: 94 mmol/L — ABNORMAL LOW (ref 98–111)
Creatinine, Ser: 9.2 mg/dL — ABNORMAL HIGH (ref 0.61–1.24)
GFR calc non Af Amer: 6 mL/min — ABNORMAL LOW (ref >60)
GFR, EST AFRICAN AMERICAN: 7 mL/min — AB (ref >60)
GLUCOSE: 166 mg/dL — AB (ref 70–99)
POTASSIUM: 4 mmol/L (ref 3.5–5.1)
SODIUM: 138 mmol/L (ref 135–145)
TOTAL PROTEIN: 8.2 g/dL — AB (ref 6.5–8.1)
Total Bilirubin: 0.7 mg/dL (ref 0.3–1.2)

## 2018-01-18 LAB — ETHANOL: Alcohol, Ethyl (B): <10 mg/dL (ref <10)

## 2018-01-18 LAB — GLUCOSE, CAPILLARY: GLUCOSE-CAPILLARY: 177 mg/dL — AB (ref 70–99)

## 2018-01-18 LAB — CBG MONITORING, ED: GLUCOSE-CAPILLARY: 146 mg/dL — AB (ref 70–99)

## 2018-01-18 MED ORDER — INSULIN ASPART 100 UNIT/ML ~~LOC~~ SOLN
0.0000 [IU] | Freq: Three times a day (TID) | SUBCUTANEOUS | Status: DC
  Administered 2018-01-19: 2 [IU] via SUBCUTANEOUS
  Administered 2018-01-20 – 2018-01-21 (×4): 7 [IU] via SUBCUTANEOUS
  Administered 2018-01-21: 5 [IU] via SUBCUTANEOUS

## 2018-01-18 MED ORDER — LACTATED RINGERS IV BOLUS
1000.0000 mL | Freq: Once | INTRAVENOUS | Status: AC
  Administered 2018-01-18: 1000 mL via INTRAVENOUS

## 2018-01-18 MED ORDER — HYDRALAZINE HCL 20 MG/ML IJ SOLN: 5.0000 mg | INTRAMUSCULAR | Status: DC | PRN

## 2018-01-18 MED ORDER — LACTATED RINGERS IV SOLN
INTRAVENOUS | Status: AC
  Administered 2018-01-19: 03:00:00 via INTRAVENOUS

## 2018-01-18 MED ORDER — LACTATED RINGERS IV BOLUS
1000.0000 mL | Freq: Once | INTRAVENOUS | Status: AC
  Administered 2018-01-19: 1000 mL via INTRAVENOUS

## 2018-01-18 MED ORDER — ASPIRIN EC 81 MG PO TBEC: 81.0000 mg | DELAYED_RELEASE_TABLET | Freq: Every day | ORAL | Status: DC

## 2018-01-18 MED ORDER — ACETAMINOPHEN 650 MG RE SUPP: 650.0000 mg | Freq: Four times a day (QID) | RECTAL | Status: DC | PRN

## 2018-01-18 MED ORDER — HEPARIN SODIUM (PORCINE) 5000 UNIT/ML IJ SOLN
5000.0000 [IU] | Freq: Three times a day (TID) | INTRAMUSCULAR | Status: DC
  Administered 2018-01-19 – 2018-01-22 (×10): 5000 [IU] via SUBCUTANEOUS
  Filled 2018-01-18 (×10): qty 1

## 2018-01-18 MED ORDER — ACETAMINOPHEN 325 MG PO TABS
650.0000 mg | ORAL_TABLET | Freq: Four times a day (QID) | ORAL | Status: DC | PRN
  Administered 2018-01-20: 650 mg via ORAL
  Filled 2018-01-18: qty 2

## 2018-01-18 MED ORDER — ONDANSETRON 4 MG PO TBDP
8.0000 mg | ORAL_TABLET | Freq: Once | ORAL | Status: AC
  Administered 2018-01-18: 8 mg via ORAL
  Filled 2018-01-18: qty 2

## 2018-01-18 NOTE — Progress Notes (Signed)
Called ER RN for report. Room ready.  

## 2018-01-18 NOTE — ED Triage Notes (Signed)
Pt. From home via EMS with reports of unstable balance with multiple falls within the last 2 weeks. Pt. Has no visible injuries and is not reporting pain. Pt is on blood thinners and has a hx. Of stroke. 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   CBG 211

## 2018-01-18 NOTE — ED Provider Notes (Signed)
[facility] EMERGENCY DEPARTMENT Provider Note   CSN: [id number] Arrival date & time: 01/18/18  1801     History   Chief Complaint No chief complaint on file.   HPI Cory Carpenter is a 57 y.o. male.  The history is provided by the patient. No language interpreter was used.  Fall  This is a new problem. The current episode started 1 to 2 hours ago. Pertinent negatives include no chest pain, no abdominal pain, no headaches and no shortness of breath. Exacerbated by: fell when trying to vomit. Nothing relieves the symptoms. He has tried nothing for the symptoms. The treatment provided no relief.   Patient is a 57 y.o. male with PMHx of CVA presenting for unwitnessed fall and ataxia. Wife states that patient has had more difficulty walking for the past few weeks, falling more. Patient is on a blood thinner, but has not been taking his insulin regularly because he ran out of needles.  Past Medical History:  Diagnosis Date  . Diabetes mellitus without complication ([place])   . Glaucoma   . Hypertension   . Narcolepsy   . Sleep apnea   . Stroke [facility])     Patient Active Problem List   Diagnosis Date Noted  . Lacunar stroke ([place])   . Stroke-like symptoms 04/15/2016  . Bradycardia 04/15/2016  . Stroke ([place]) 04/15/2016  . Noncompliance with medications   . Intracranial carotid stenosis, right   . CVA (cerebral vascular accident) ([place]) 01/17/2016  . DM (diabetes mellitus), secondary, uncontrolled, w/neurologic complic ([place]) 51/70/0174  . Renal insufficiency 01/17/2016  . Essential hypertension 10/22/2008  . PLEURAL EFFUSION 10/22/2008    History reviewed. No pertinent surgical history.      Home Medications    Prior to Admission medications   Medication Sig Start Date End Date Taking? Authorizing Provider  aspirin EC 81 MG tablet Take 81 mg by mouth daily.    [provider]  atorvastatin (LIPITOR) 80 MG tablet Take 1 tablet (80 mg total) by mouth  daily at 6 PM. 01/19/16   Lavina Hamman, MD  Brinzolamide-Brimonidine 1-0.2 % SUSP Place 1 drop into both eyes 3 (three) times daily.    [provider]  cholecalciferol (VITAMIN D) 1000 units tablet Take 1,000 Units by mouth daily.    [provider]  clopidogrel (PLAVIX) 75 MG tablet Take 1 tablet (75 mg total) by mouth daily. 01/20/16   Lavina Hamman, MD  dorzolamide-timolol (COSOPT) 22.3-6.8 MG/ML ophthalmic solution Place 1 drop into the left eye 2 (two) times daily.    [provider]  insulin aspart (NOVOLOG) 100 UNIT/ML injection Inject 10 Units into the skin 3 (three) times daily before meals. 01/19/16   Lavina Hamman, MD  insulin glargine (LANTUS) 100 UNIT/ML injection Inject 0.35 mLs (35 Units total) into the skin daily. 04/17/16   Regalado, Belkys A, MD  lisinopril (PRINIVIL,ZESTRIL) 40 MG tablet Take 40 mg by mouth daily.    [provider]  metFORMIN (GLUCOPHAGE-XR) 500 MG 24 hr tablet Take 1,000 mg by mouth 2 (two) times daily.    [provider]  timolol (TIMOPTIC-XR) 0.5 % ophthalmic gel-forming Place 1 drop into both eyes daily.    [provider]    Family History History reviewed. No pertinent family history.  Social History Social History   Tobacco Use  . Smoking status: Former Research scientist (life sciences)  . Smokeless tobacco: Never Used  Substance Use Topics  . Alcohol use: Yes  Comment: occ   . Drug use: No     Allergies   Patient has no known allergies.   Review of Systems Review of Systems  Constitutional: Positive for activity change, appetite change and fatigue. Negative for chills and fever.  HENT: Negative for ear pain and sore throat.   Eyes: Negative for pain and visual disturbance.  Respiratory: Negative for cough and shortness of breath.   Cardiovascular: Negative for chest pain and palpitations.  Gastrointestinal: Positive for nausea and vomiting. Negative for abdominal distention, abdominal pain,  constipation and diarrhea.  Genitourinary: Negative for dysuria and hematuria.  Musculoskeletal: Positive for gait problem. Negative for arthralgias and back pain.  Skin: Negative for color change and rash.  Neurological: Negative for seizures, syncope and headaches.  All other systems reviewed and are negative.  Physical Exam Updated Vital Signs BP 123/69   Pulse 66   Temp 99 F (37.2 C) (Oral)   Resp (!) 26   Ht 5\' 9"  (1.753 m)   Wt 94.8 kg   SpO2 97%   BMI 30.86 kg/m   Physical Exam  Constitutional: He is oriented to person, place, and time. He appears well-developed and well-nourished.  HENT:  Head: Normocephalic and atraumatic.  Eyes: Pupils are equal, round, and reactive to light. Conjunctivae and EOM are normal.  Neck: Neck supple.  Cardiovascular: Normal rate and regular rhythm.  No murmur heard. Pulmonary/Chest: Effort normal and breath sounds normal. No respiratory distress.  Abdominal: Soft. He exhibits no distension. There is no tenderness.  Musculoskeletal: He exhibits no edema.  Neurological: He is alert and oriented to person, place, and time. No cranial nerve deficit or sensory deficit. He exhibits normal muscle tone. Coordination normal. GCS eye subscore is 4. GCS verbal subscore is 5. GCS motor subscore is 6.  Skin: Skin is warm and dry.  Nursing note and vitals reviewed.  ED Treatments / Results  Labs (all labs ordered are listed, but only abnormal results are displayed) Labs Reviewed  COMPREHENSIVE METABOLIC PANEL  CBC WITH DIFFERENTIAL/PLATELET  ETHANOL  BLOOD GAS, VENOUS  URINALYSIS, ROUTINE W REFLEX MICROSCOPIC  CBG MONITORING, ED    EKG None  Radiology No results found.  Procedures Procedures (including critical care time)  Medications Ordered in ED Medications  ondansetron (ZOFRAN-ODT) disintegrating tablet 8 mg (8 mg Oral Given 01/18/18 1839)     Initial Impression / Assessment and Plan / ED Course  I have reviewed the triage  vital signs and the nursing notes.  Pertinent labs & imaging results that were available during my care of the patient were reviewed by me and considered in my medical decision making (see chart for details).     Patient is a 57 y.o. male with PMHx of DM, CVA presenting for unwitnessed fall at home today and unable to ambulate without help in the past two weeks.  Patient is alert and oriented, normal neurologic exam.  HCT unremarkable. Lab shows elevation in creatinine from 1.1 to 9.2. Anion gap elevated, VBG shows normal pH. Glucose normal.  1L LR bolus given.  Patient will be admitted for further observation and work up of AKI. Additional bolus given (1L LR).   Final Clinical Impressions(s) / ED Diagnoses   Final diagnoses:  ARF (acute renal failure) [facility])    ED Discharge Orders    None       Erskine Squibb, MD 01/18/18 6283    Carmin Muskrat, MD 01/19/18 [phone]

## 2018-01-18 NOTE — H&P (Signed)
History and Physical    Cory Carpenter UMP:[id number] DOB: [birth year] DOA: 01/18/2018  PCP: [facility]  Patient coming from: Home.  Chief Complaint: Difficulty walking.  HPI: Cory Carpenter is a 57 y.o. male with history of stroke in February 2018, diabetes mellitus type 2, hypertension, chronic disease, sleep apnea and narcolepsy on CPAP has been feeling increasingly difficult to walk with balance issues over the last 3 weeks.  Patient states he has been having some gait difficulties since his last stroke in February 2018 but over the last 3 weeks it is acutely worsened.  He has been taking care of his wife who has been treated for flu and bronchitis for the last few days.  Patient states over the last week and half has been having nausea vomiting and diarrhea vomiting is at least 4-5 episodes daily and diarrhea at least 2 episodes daily.  Denies any blood in the diarrhea or vomitus.  Has been having some hiccups.  Also has had at least 6 falls in the last 2 weeks.  Denies losing consciousness but has had times at his head.  ED Course: In the ER patient appears generally weak.  Difficult to walk.  Labs show acute renal failure with creatinine of 9 with urine showing mild proteinuria otherwise no casts.  Patient states he is making urine.  CT of the head was unremarkable.  On my exam patient able to move all extremities but appears a little bit ataxic.  Patient was given Ringer lactate 1 L fluid bolus and admitted for acute renal failure and further work-up of his difficulty with walking.  Review of Systems: As per HPI, rest all negative.   Past Medical History:  Diagnosis Date  . Diabetes mellitus without complication ([place])   . Glaucoma   . Hypertension   . Narcolepsy   . Sleep apnea   . Stroke [facility])     History reviewed. No pertinent surgical history.   reports that he has quit smoking. He has never used smokeless tobacco. He reports that he drinks alcohol. He reports that  he does not use drugs.  No Known Allergies  Family History  Problem Relation Age of Onset  . Diabetes Mellitus II Mother   . Kidney disease Mother   . Stroke Mother     Prior to Admission medications   Medication Sig Start Date End Date Taking? Authorizing Provider  aspirin EC 81 MG tablet Take 81 mg by mouth daily.    [provider]  atorvastatin (LIPITOR) 80 MG tablet Take 1 tablet (80 mg total) by mouth daily at 6 PM. 01/19/16   Lavina Hamman, MD  Brinzolamide-Brimonidine 1-0.2 % SUSP Place 1 drop into both eyes 3 (three) times daily.    [provider]  cholecalciferol (VITAMIN D) 1000 units tablet Take 1,000 Units by mouth daily.    [provider]  clopidogrel (PLAVIX) 75 MG tablet Take 1 tablet (75 mg total) by mouth daily. 01/20/16   Lavina Hamman, MD  dorzolamide-timolol (COSOPT) 22.3-6.8 MG/ML ophthalmic solution Place 1 drop into the left eye 2 (two) times daily.    [provider]  insulin aspart (NOVOLOG) 100 UNIT/ML injection Inject 10 Units into the skin 3 (three) times daily before meals. 01/19/16   Lavina Hamman, MD  insulin glargine (LANTUS) 100 UNIT/ML injection Inject 0.35 mLs (35 Units total) into the skin daily. 04/17/16   Regalado, Belkys A, MD  lisinopril (PRINIVIL,ZESTRIL) 40 MG tablet Take 40 mg  by mouth daily.    [provider]  metFORMIN (GLUCOPHAGE-XR) 500 MG 24 hr tablet Take 1,000 mg by mouth 2 (two) times daily.    [provider]  timolol (TIMOPTIC-XR) 0.5 % ophthalmic gel-forming Place 1 drop into both eyes daily.    [provider]    Physical Exam: Vitals:   01/18/18 1830 01/18/18 1845 01/18/18 1958 01/18/18 2233  BP: (!) 117/57  (!) 147/125 (!) 137/111  Pulse:  67 62 (!) 52  Resp:  16 16 20   Temp:    97.7 F (36.5 C)  TempSrc:    Oral  SpO2:  98% 98% 94%  Weight:      Height:          Constitutional: Moderately built and nourished. Vitals:   01/18/18 1830 01/18/18  1845 01/18/18 1958 01/18/18 2233  BP: (!) 117/57  (!) 147/125 (!) 137/111  Pulse:  67 62 (!) 52  Resp:  16 16 20   Temp:    97.7 F (36.5 C)  TempSrc:    Oral  SpO2:  98% 98% 94%  Weight:      Height:       Eyes: Anicteric no pallor. ENMT: No discharge from the ears eyes nose or mouth. Neck: No mass felt.  No neck rigidity.  No JVD appreciated. Respiratory: No rhonchi or crepitations. Cardiovascular: S1-S2 heard no murmurs appreciated. Abdomen: Soft nontender bowel sounds present. Musculoskeletal: No edema.  No joint effusion. Skin: No rash. Neurologic: Alert awake oriented to time place and person.  Moves all extremities 5 x 5.  No facial asymmetry.  No tongue deviation.  Blind in left eye. Psychiatric: Appears normal.   Labs on Admission: I have personally reviewed following labs and imaging studies  CBC: Recent Labs  Lab 01/18/18 1715  WBC 7.3  NEUTROABS 4.6  HGB 13.3  HCT 40.2  MCV 91.8  PLT 892   Basic Metabolic Panel: Recent Labs  Lab 01/18/18 1818  NA 138  K 4.0  CL 94*  CO2 24  GLUCOSE 166*  BUN 84*  CREATININE 9.20*  CALCIUM 9.2   GFR: Estimated Creatinine Clearance: 10.2 mL/min (A) (by C-G formula based on SCr of 9.2 mg/dL (H)). Liver Function Tests: Recent Labs  Lab 01/18/18 1818  AST 22  ALT 12  ALKPHOS 75  BILITOT 0.7  PROT 8.2*  ALBUMIN 4.1   No results for input(s): LIPASE, AMYLASE in the last 168 hours. No results for input(s): AMMONIA in the last 168 hours. Coagulation Profile: No results for input(s): INR, PROTIME in the last 168 hours. Cardiac Enzymes: No results for input(s): CKTOTAL, CKMB, CKMBINDEX, TROPONINI in the last 168 hours. BNP (last 3 results) No results for input(s): PROBNP in the last 8760 hours. HbA1C: No results for input(s): HGBA1C in the last 72 hours. CBG: Recent Labs  Lab 01/18/18 1932 01/18/18 2236  GLUCAP 146* 177*   Lipid Profile: No results for input(s): CHOL, HDL, LDLCALC, TRIG, CHOLHDL,  LDLDIRECT in the last 72 hours. Thyroid Function Tests: No results for input(s): TSH, T4TOTAL, FREET4, T3FREE, THYROIDAB in the last 72 hours. Anemia Panel: No results for input(s): VITAMINB12, FOLATE, FERRITIN, TIBC, IRON, RETICCTPCT in the last 72 hours. Urine analysis:    Component Value Date/Time   COLORURINE YELLOW 01/18/2018 2117   APPEARANCEUR HAZY (A) 01/18/2018 2117   LABSPEC 1.012 01/18/2018 2117   PHURINE 5.0 01/18/2018 2117   GLUCOSEU >=500 (A) 01/18/2018 2117   HGBUR NEGATIVE 01/18/2018 2117   BILIRUBINUR  NEGATIVE 01/18/2018 2117   [place] NEGATIVE 01/18/2018 2117   PROTEINUR 30 (A) 01/18/2018 2117   UROBILINOGEN 0.2 02/16/2010 1154   NITRITE NEGATIVE 01/18/2018 2117   LEUKOCYTESUR NEGATIVE 01/18/2018 2117   Sepsis Labs: @LABRCNTIP (procalcitonin:4,lacticidven:4) )No results found for this or any previous visit (from the past 240 hour(s)).   Radiological Exams on Admission: Ct Head Wo Contrast  Result Date: 01/18/2018 CLINICAL DATA:  Instability with multiple falls over the past 2 weeks. EXAM: CT HEAD WITHOUT CONTRAST TECHNIQUE: Contiguous axial images were obtained from the base of the skull through the vertex without intravenous contrast. COMPARISON:  04/15/2016 MRI of the brain FINDINGS: Brain: Remote pontine infarct on the right with encephalomalacia. Remote infarcts in the right MCA territory with encephalomalacia involving the right frontal and parietal lobes as well as anterior right basal ganglia. No hydrocephalus. Mild ex vacuo dilatation of the frontal horn of the right lateral ventricle from the described infarcts. No acute intracranial hemorrhage, midline shift or edema. No extra-axial fluid. Vascular: No hyperdense vessel sign. Atherosclerosis of the carotid siphons bilaterally. Skull: Intact Sinuses/Orbits: Intact Other: None IMPRESSION: Remote infarcts of the right pons and MCA distribution. No acute intracranial abnormality. Electronically Signed   By: Ashley Royalty M.D.   On: 01/18/2018 20:54    EKG: Independently reviewed.  Normal sinus rhythm.  Assessment/Plan Principal Problem:   Acute renal failure (ARF) (HCC) Active Problems:   Essential hypertension   DM (diabetes mellitus), secondary, uncontrolled, w/neurologic complic (H[facility])   Ataxia   Nausea vomiting and diarrhea    1. Acute renal failure -cause not clear.  Patient does not recall his home medication which has to be reviewed.  Did have some vomiting and diarrhea over the last week.  Will check FENa SPEP and start sonogram of the abdomen to rule out any obstruction.  Closely follow intake output metabolic panel continue with hydration for now will need renal input if creatinine does not improve. 2. Ataxia and difficulty with gait -given the previous history of stroke we will get MRI brain and if positive for stroke will get neurology consult. 3. Diabetes mellitus type 2 -patient states he takes long-acting insulin.  For now we will hold off long-acting insulin and keep patient on sliding scale coverage due to acute renal failure.  Closely follow CBGs. 4. Nausea vomiting and diarrhea -nausea vomiting could be due to uremia.  If there is any further episodes of diarrhea will need to check stool studies.  Symptoms may be from viral gastritis also. 5. History of sleep apnea and narcolepsy on CPAP. 6. Hypertension -Home medications needs to be verified.  For now we will keep patient on PRN IV hydralazine.  If patient's MRI is positive for stroke and will need to allow for permissive hypertension.   DVT prophylaxis: Heparin. Code Status: Full code. Family Communication: Discussed with patient. Disposition Plan: Home. Consults called: Neurology. Admission status: Inpatient.   Rise Patience MD [facility] Pager [phone].  If 7PM-7AM, please contact night-coverage [email or web address] Password [facility]  01/18/2018, 11:53 PM

## 2018-01-19 ENCOUNTER — Inpatient Hospital Stay (HOSPITAL_COMMUNITY): Payer: Federal, State, Local not specified - PPO

## 2018-01-19 ENCOUNTER — Other Ambulatory Visit (HOSPITAL_COMMUNITY): Payer: Federal, State, Local not specified - PPO

## 2018-01-19 ENCOUNTER — Other Ambulatory Visit: Payer: Self-pay

## 2018-01-19 ENCOUNTER — Encounter (HOSPITAL_COMMUNITY): Payer: Self-pay | Admitting: *Deleted

## 2018-01-19 DIAGNOSIS — R112 Nausea with vomiting, unspecified: Secondary | ICD-10-CM

## 2018-01-19 DIAGNOSIS — R197 Diarrhea, unspecified: Secondary | ICD-10-CM

## 2018-01-19 DIAGNOSIS — I679 Cerebrovascular disease, unspecified: Secondary | ICD-10-CM

## 2018-01-19 DIAGNOSIS — I639 Cerebral infarction, unspecified: Secondary | ICD-10-CM

## 2018-01-19 DIAGNOSIS — I6330 Cerebral infarction due to thrombosis of unspecified cerebral artery: Secondary | ICD-10-CM

## 2018-01-19 LAB — BASIC METABOLIC PANEL
Anion gap: 15 (ref 5–15)
BUN: 78 mg/dL — ABNORMAL HIGH (ref 6–20)
CHLORIDE: 97 mmol/L — AB (ref 98–111)
CO2: 26 mmol/L (ref 22–32)
CREATININE: 8.68 mg/dL — AB (ref 0.61–1.24)
Calcium: 8.2 mg/dL — ABNORMAL LOW (ref 8.9–10.3)
GFR calc non Af Amer: 6 mL/min — ABNORMAL LOW (ref >60)
GFR, EST AFRICAN AMERICAN: 7 mL/min — AB (ref >60)
Glucose, Bld: 205 mg/dL — ABNORMAL HIGH (ref 70–99)
Potassium: 3.4 mmol/L — ABNORMAL LOW (ref 3.5–5.1)
Sodium: 138 mmol/L (ref 135–145)

## 2018-01-19 LAB — HEPATIC FUNCTION PANEL
ALBUMIN: 3.3 g/dL — AB (ref 3.5–5.0)
ALT: 12 U/L (ref 0–44)
AST: 15 U/L (ref 15–41)
Alkaline Phosphatase: 58 U/L (ref 38–126)
BILIRUBIN DIRECT: 0.1 mg/dL (ref 0.0–0.2)
BILIRUBIN TOTAL: 0.5 mg/dL (ref 0.3–1.2)
Indirect Bilirubin: 0.4 mg/dL (ref 0.3–0.9)
Total Protein: 6.2 g/dL — ABNORMAL LOW (ref 6.5–8.1)

## 2018-01-19 LAB — CBC WITH DIFFERENTIAL/PLATELET
ABS IMMATURE GRANULOCYTES: 0.01 10*3/uL (ref 0.00–0.07)
BASOS ABS: 0 10*3/uL (ref 0.0–0.1)
Basophils Relative: 0 %
EOS ABS: 0.1 10*3/uL (ref 0.0–0.5)
Eosinophils Relative: 1 %
HEMATOCRIT: 33.9 % — AB (ref 39.0–52.0)
HEMOGLOBIN: 11.3 g/dL — AB (ref 13.0–17.0)
IMMATURE GRANULOCYTES: 0 %
LYMPHS ABS: 2.3 10*3/uL (ref 0.7–4.0)
LYMPHS PCT: 42 %
MCH: 30.3 pg (ref 26.0–34.0)
MCHC: 33.3 g/dL (ref 30.0–36.0)
MCV: 90.9 fL (ref 80.0–100.0)
MONOS PCT: 11 %
Monocytes Absolute: 0.6 10*3/uL (ref 0.1–1.0)
NEUTROS ABS: 2.5 10*3/uL (ref 1.7–7.7)
NEUTROS PCT: 46 %
NRBC: 0 % (ref 0.0–0.2)
Platelets: 204 10*3/uL (ref 150–400)
RBC: 3.73 MIL/uL — ABNORMAL LOW (ref 4.22–5.81)
RDW: 11.1 % — AB (ref 11.5–15.5)
WBC: 5.4 10*3/uL (ref 4.0–10.5)

## 2018-01-19 LAB — CBC
HEMATOCRIT: 36.1 % — AB (ref 39.0–52.0)
HEMOGLOBIN: 11.8 g/dL — AB (ref 13.0–17.0)
MCH: 29.9 pg (ref 26.0–34.0)
MCHC: 32.7 g/dL (ref 30.0–36.0)
MCV: 91.6 fL (ref 80.0–100.0)
NRBC: 0 % (ref 0.0–0.2)
Platelets: 245 10*3/uL (ref 150–400)
RBC: 3.94 MIL/uL — ABNORMAL LOW (ref 4.22–5.81)
RDW: 11.1 % — AB (ref 11.5–15.5)
WBC: 6.5 10*3/uL (ref 4.0–10.5)

## 2018-01-19 LAB — TROPONIN I
Troponin I: <0.03 ng/mL (ref <0.03)
Troponin I: 0.03 ng/mL (ref <0.03)
Troponin I: <0.03 ng/mL (ref <0.03)

## 2018-01-19 LAB — LIPID PANEL
Cholesterol: 63 mg/dL (ref 0–200)
HDL: 17 mg/dL — ABNORMAL LOW
LDL Cholesterol: NEGATIVE mg/dL (ref 0–99)
Total CHOL/HDL Ratio: 3.7 ratio
Triglycerides: 234 mg/dL — ABNORMAL HIGH
VLDL: 47 mg/dL — ABNORMAL HIGH (ref 0–40)

## 2018-01-19 LAB — GLUCOSE, CAPILLARY
GLUCOSE-CAPILLARY: 159 mg/dL — AB (ref 70–99)
GLUCOSE-CAPILLARY: 127 mg/dL — AB (ref 70–99)
GLUCOSE-CAPILLARY: 253 mg/dL — AB (ref 70–99)

## 2018-01-19 LAB — CK: Total CK: 144 U/L (ref 49–397)

## 2018-01-19 LAB — CREATININE, SERUM
Creatinine, Ser: 9.02 mg/dL — ABNORMAL HIGH (ref 0.61–1.24)
GFR calc Af Amer: 7 mL/min — ABNORMAL LOW (ref >60)
GFR calc non Af Amer: 6 mL/min — ABNORMAL LOW (ref >60)

## 2018-01-19 LAB — HEMOGLOBIN A1C
HEMOGLOBIN A1C: 10.7 % — AB (ref 4.8–5.6)
Mean Plasma Glucose: 260.39 mg/dL

## 2018-01-19 LAB — TSH: TSH: 0.103 u[IU]/mL — AB (ref 0.350–4.500)

## 2018-01-19 LAB — HIV ANTIBODY (ROUTINE TESTING W REFLEX): HIV Screen 4th Generation wRfx: NONREACTIVE

## 2018-01-19 LAB — PHOSPHORUS: PHOSPHORUS: 5.1 mg/dL — AB (ref 2.5–4.6)

## 2018-01-19 MED ORDER — BRIMONIDINE TARTRATE 0.2 % OP SOLN
1.0000 [drp] | Freq: Three times a day (TID) | OPHTHALMIC | Status: DC
  Administered 2018-01-19 – 2018-01-22 (×9): 1 [drp] via OPHTHALMIC
  Filled 2018-01-19: qty 5

## 2018-01-19 MED ORDER — BENZONATATE 100 MG PO CAPS
100.0000 mg | ORAL_CAPSULE | Freq: Three times a day (TID) | ORAL | Status: DC | PRN
  Administered 2018-01-20 – 2018-01-22 (×4): 100 mg via ORAL
  Filled 2018-01-19 (×4): qty 1

## 2018-01-19 MED ORDER — TICAGRELOR 90 MG PO TABS
90.0000 mg | ORAL_TABLET | Freq: Two times a day (BID) | ORAL | Status: DC
  Administered 2018-01-20 – 2018-01-22 (×5): 90 mg via ORAL
  Filled 2018-01-19 (×6): qty 1

## 2018-01-19 MED ORDER — ASPIRIN 300 MG RE SUPP
300.0000 mg | Freq: Every day | RECTAL | Status: DC
  Filled 2018-01-19: qty 1

## 2018-01-19 MED ORDER — ASPIRIN 325 MG PO TABS
325.0000 mg | ORAL_TABLET | Freq: Every day | ORAL | Status: DC
  Administered 2018-01-19: 325 mg via ORAL
  Filled 2018-01-19: qty 1

## 2018-01-19 MED ORDER — CLOPIDOGREL BISULFATE 75 MG PO TABS
75.0000 mg | ORAL_TABLET | Freq: Every day | ORAL | Status: DC
  Administered 2018-01-19: 75 mg via ORAL
  Filled 2018-01-19: qty 1

## 2018-01-19 MED ORDER — BRINZOLAMIDE 1 % OP SUSP
1.0000 [drp] | Freq: Three times a day (TID) | OPHTHALMIC | Status: DC
  Administered 2018-01-19 – 2018-01-22 (×9): 1 [drp] via OPHTHALMIC
  Filled 2018-01-19: qty 10

## 2018-01-19 MED ORDER — ATORVASTATIN CALCIUM 80 MG PO TABS
80.0000 mg | ORAL_TABLET | Freq: Every day | ORAL | Status: DC
  Administered 2018-01-19: 80 mg via ORAL
  Filled 2018-01-19: qty 1

## 2018-01-19 MED ORDER — TIMOLOL MALEATE 0.5 % OP SOLG
1.0000 [drp] | Freq: Every day | OPHTHALMIC | Status: DC
  Administered 2018-01-19 – 2018-01-22 (×4): 1 [drp] via OPHTHALMIC
  Filled 2018-01-19: qty 5

## 2018-01-19 MED ORDER — STROKE: EARLY STAGES OF RECOVERY BOOK
Freq: Once | Status: AC
  Administered 2018-01-19: 12:00:00
  Filled 2018-01-19: qty 1

## 2018-01-19 MED ORDER — HYDRALAZINE HCL 20 MG/ML IJ SOLN: 10.0000 mg | INTRAMUSCULAR | Status: DC | PRN

## 2018-01-19 MED ORDER — ASPIRIN EC 81 MG PO TBEC
81.0000 mg | DELAYED_RELEASE_TABLET | Freq: Every day | ORAL | Status: DC
  Administered 2018-01-20 – 2018-01-22 (×3): 81 mg via ORAL
  Filled 2018-01-19 (×3): qty 1

## 2018-01-19 NOTE — Progress Notes (Signed)
Gave report to [facility]. Patient's family notified of transfer to [facility]. Patient will transfer after [place].   Farley Ly RN

## 2018-01-19 NOTE — Progress Notes (Signed)
PROGRESS NOTE    Cory Carpenter  OAC:[id number] DOB: September 13, 1960 DOA: 01/18/2018 PCP: [facility]    Brief Narrative:  57 y.o. male with history of stroke in February 2018, diabetes mellitus type 2, hypertension, chronic disease, sleep apnea and narcolepsy on CPAP has been feeling increasingly difficult to walk with balance issues over the last 3 weeks.  Patient states he has been having some gait difficulties since his last stroke in February 2018 but over the last 3 weeks it is acutely worsened.  He has been taking care of his wife who has been treated for flu and bronchitis for the last few days.  Patient states over the last week and half has been having nausea vomiting and diarrhea vomiting is at least 4-5 episodes daily and diarrhea at least 2 episodes daily.  Denies any blood in the diarrhea or vomitus.  Has been having some hiccups.  Also has had at least 6 falls in the last 2 weeks.  Denies losing consciousness but has had times at his head.  ED Course: In the ER patient appears generally weak.  Difficult to walk.  Labs show acute renal failure with creatinine of 9 with urine showing mild proteinuria otherwise no casts.  Patient states he is making urine.  CT of the head was unremarkable.  On my exam patient able to move all extremities but appears a little bit ataxic.  Patient was given Ringer lactate 1 L fluid bolus and admitted for acute renal failure and further work-up of his difficulty with walking.  Assessment & Plan:   Principal Problem:   Acute renal failure (ARF) (HCC) Active Problems:   Essential hypertension   DM (diabetes mellitus), secondary, uncontrolled, w/neurologic complic (HCC)   Ataxia   Nausea vomiting and diarrhea   Cerebral thrombosis with cerebral infarction  1. Acute renal failure 1. Cr of over 9 at presentation 2. Patient presented with n/v and poor po intake in setting of HCTZ and Lisinopril 3. Consider pre-renal azotemia vs ATN 4. Patient is  voiding, does not appear to be uremic 5. Continue IVF hydration 6. Recommend indwelling cath for strict I/O 7. If renal function worsens or if patient becomes oliguric, would consult Nephrology at that time 8. Avoid nephrotoxic agents 2. Ataxia and difficulty with gait with acute CVA 1. Patient underwent MRI brain with findings of acute CVA 3. Diabetes mellitus type 2 1. Long-acting insulin held secondary to worsening renal function 2. Continue SSI coverage 4. Nausea vomiting and diarrhea  1. Continue supportive care for now with IVF 2. If persistent, consider stool studies 5. History of sleep apnea and narcolepsy on CPAP. 6. Hypertension 1. Continue on PRN hydralazine  DVT prophylaxis: Heparin subQ Code Status: Full Family Communication: Pt in room, family not at bedsiee Disposition Plan: Uncertain at this time  Consultants:   Neurology  Procedures:     Antimicrobials: Anti-infectives (From admission, onward)   None       Subjective: Complaining of cough  Objective: Vitals:   01/18/18 1958 01/18/18 2233 01/19/18 0503 01/19/18 1041  BP: (!) 147/125 (!) 137/111 (!) 142/43 (!) 92/48  Pulse: 62 (!) 52 66 64  Resp: 16 20 19 14   Temp:  97.7 F (36.5 C) 98.9 F (37.2 C) 98.7 F (37.1 C)  TempSrc:  Oral Oral Axillary  SpO2: 98% 94% 95% 93%  Weight:    85.4 kg  Height:    5\' 9"  (1.753 m)    Intake/Output Summary (Last 24 hours) at  01/19/2018 1456 Last data filed at 01/19/2018 0900 Gross per 24 hour  Intake 1987.3 ml  Output 1000 ml  Net 987.3 ml   Filed Weights   01/18/18 1809 01/19/18 1041  Weight: 94.8 kg 85.4 kg    Examination: General exam: Appears calm and comfortable  Respiratory system: Clear to auscultation. Respiratory effort normal. Cardiovascular system: S1 & S2 heard, RRR. Gastrointestinal system: Abdomen is nondistended, soft and nontender. No organomegaly or masses felt. Normal bowel sounds heard. Central nervous system: Alert and  oriented. No focal neurological deficits. Extremities: Symmetric 5 x 5 power. Skin: No rashes, lesions Psychiatry: Judgement and insight appear normal. Mood & affect appropriate.   Data Reviewed: I have personally reviewed following labs and imaging studies  CBC: Recent Labs  Lab 01/18/18 1715 01/19/18 0059 01/19/18 0634  WBC 7.3 6.5 5.4  NEUTROABS 4.6  --  2.5  HGB 13.3 11.8* 11.3*  HCT 40.2 36.1* 33.9*  MCV 91.8 91.6 90.9  PLT 273 245 950   Basic Metabolic Panel: Recent Labs  Lab 01/18/18 1818 01/19/18 0059 01/19/18 0634  NA 138  --  138  K 4.0  --  3.4*  CL 94*  --  97*  CO2 24  --  26  GLUCOSE 166*  --  205*  BUN 84*  --  78*  CREATININE 9.20* 9.02* 8.68*  CALCIUM 9.2  --  8.2*  PHOS  --  5.1*  --    GFR: Estimated Creatinine Clearance: 10.3 mL/min (A) (by C-G formula based on SCr of 8.68 mg/dL (H)). Liver Function Tests: Recent Labs  Lab 01/18/18 1818 01/19/18 0634  AST 22 15  ALT 12 12  ALKPHOS 75 58  BILITOT 0.7 0.5  PROT 8.2* 6.2*  ALBUMIN 4.1 3.3*   No results for input(s): LIPASE, AMYLASE in the last 168 hours. No results for input(s): AMMONIA in the last 168 hours. Coagulation Profile: No results for input(s): INR, PROTIME in the last 168 hours. Cardiac Enzymes: Recent Labs  Lab 01/19/18 0059 01/19/18 0634 01/19/18 1132  CKTOTAL 144  --   --   TROPONINI <0.03 0.03* <0.03   BNP (last 3 results) No results for input(s): PROBNP in the last 8760 hours. HbA1C: Recent Labs    01/19/18 0634  HGBA1C 10.7*   CBG: Recent Labs  Lab 01/18/18 1932 01/18/18 2236 01/19/18 0727  GLUCAP 146* 177* 159*   Lipid Profile: Recent Labs    01/19/18 0634  CHOL 63  HDL 17*  LDLCALC NEG 1  TRIG 234*  CHOLHDL 3.7   Thyroid Function Tests: Recent Labs    01/19/18 0059  TSH 0.103*   Anemia Panel: No results for input(s): VITAMINB12, FOLATE, FERRITIN, TIBC, IRON, RETICCTPCT in the last 72 hours. Sepsis Labs: No results for input(s):  PROCALCITON, LATICACIDVEN in the last 168 hours.  No results found for this or any previous visit (from the past 240 hour(s)).   Radiology Studies: Ct Head Wo Contrast  Result Date: 01/18/2018 CLINICAL DATA:  Instability with multiple falls over the past 2 weeks. EXAM: CT HEAD WITHOUT CONTRAST TECHNIQUE: Contiguous axial images were obtained from the base of the skull through the vertex without intravenous contrast. COMPARISON:  04/15/2016 MRI of the brain FINDINGS: Brain: Remote pontine infarct on the right with encephalomalacia. Remote infarcts in the right MCA territory with encephalomalacia involving the right frontal and parietal lobes as well as anterior right basal ganglia. No hydrocephalus. Mild ex vacuo dilatation of the frontal horn of the right  lateral ventricle from the described infarcts. No acute intracranial hemorrhage, midline shift or edema. No extra-axial fluid. Vascular: No hyperdense vessel sign. Atherosclerosis of the carotid siphons bilaterally. Skull: Intact Sinuses/Orbits: Intact Other: None IMPRESSION: Remote infarcts of the right pons and MCA distribution. No acute intracranial abnormality. Electronically Signed   By: Ashley Royalty M.D.   On: 01/18/2018 20:54   [name] Head Wo Contrast  Result Date: 01/19/2018 CLINICAL DATA:  Progressive imbalance with difficulty walking. Punctate acute/subacute infarcts in the inferior right thalamic region on MRI. EXAM: MRA NECK WITHOUT CONTRAST MRA HEAD WITHOUT CONTRAST TECHNIQUE: Multiplanar and multiecho pulse sequences of the neck were obtained without intravenous contrast. Angiographic images of the neck were obtained using MRA technique without intravenous contast. Angiographic images of the Circle of Willis were obtained using MRA technique without intravenous contrast. COMPARISON:  Head MRA 04/15/2016.  Neck CTA 01/18/2016. FINDINGS: MRA HEAD FINDINGS The visualized distal vertebral arteries are patent to the basilar with the left being  dominant. Patent left PICA, bilateral AICA, and bilateral SCA origins are identified. The basilar artery is widely patent. There is a patent left posterior communicating artery. A right posterior communicating artery is also present though demonstrates diminished signal particularly proximally and distally which may reflect underlying atherosclerotic narrowing. There are severe tandem right P2 stenoses which have progressed from the prior MRA. There is at most mild proximal left P2 stenosis. The internal carotid arteries are patent from skull base to carotid termini without stenosis on the left. There is moderate stenosis of the right supraclinoid ICA, less severe in appearance compared to the prior MRA. There is progressive narrowing of the right ICA terminus including a new severe ACA origin stenosis and a persistent severe MCA origin stenosis. ACAs and MCAs are patent without evidence of proximal branch occlusion. There is no significant left A1 or left M1 stenosis. A severe proximal left M2 inferior division stenosis is unchanged. No aneurysm is identified. MRA NECK FINDINGS Examination is mildly limited by motion artifact and noncontrast technique. The aortic arch and proximal common carotid arteries were not imaged. The included portion of the common carotid and cervical internal carotid arteries are patent without evidence of significant stenosis. Minimal narrowing of the distal right cervical ICA is similar to the prior neck CTA. The vertebral arteries are patent with antegrade flow bilaterally. The left vertebral artery is dominant without evidence of stenosis or dissection. No significant right vertebral artery stenosis is identified although the proximal V1 segment is suboptimally evaluated. IMPRESSION: 1. No large vessel occlusion. 2. Progressive intracranial atherosclerosis with severe stenoses involving the right P2 segment and right ACA and right MCA origins. Moderate right ICA supraclinoid and  terminus stenoses. 3. Patent carotid and vertebral arteries in the neck without significant stenosis identified within mild limitations as detailed above. Electronically Signed   By: Logan Bores M.D.   On: 01/19/2018 10:55   [name] Neck Wo Contrast  Result Date: 01/19/2018 CLINICAL DATA:  Progressive imbalance with difficulty walking. Punctate acute/subacute infarcts in the inferior right thalamic region on MRI. EXAM: MRA NECK WITHOUT CONTRAST MRA HEAD WITHOUT CONTRAST TECHNIQUE: Multiplanar and multiecho pulse sequences of the neck were obtained without intravenous contrast. Angiographic images of the neck were obtained using MRA technique without intravenous contast. Angiographic images of the Circle of Willis were obtained using MRA technique without intravenous contrast. COMPARISON:  Head MRA 04/15/2016.  Neck CTA 01/18/2016. FINDINGS: MRA HEAD FINDINGS The visualized distal vertebral arteries are patent to the basilar  with the left being dominant. Patent left PICA, bilateral AICA, and bilateral SCA origins are identified. The basilar artery is widely patent. There is a patent left posterior communicating artery. A right posterior communicating artery is also present though demonstrates diminished signal particularly proximally and distally which may reflect underlying atherosclerotic narrowing. There are severe tandem right P2 stenoses which have progressed from the prior MRA. There is at most mild proximal left P2 stenosis. The internal carotid arteries are patent from skull base to carotid termini without stenosis on the left. There is moderate stenosis of the right supraclinoid ICA, less severe in appearance compared to the prior MRA. There is progressive narrowing of the right ICA terminus including a new severe ACA origin stenosis and a persistent severe MCA origin stenosis. ACAs and MCAs are patent without evidence of proximal branch occlusion. There is no significant left A1 or left M1 stenosis. A  severe proximal left M2 inferior division stenosis is unchanged. No aneurysm is identified. MRA NECK FINDINGS Examination is mildly limited by motion artifact and noncontrast technique. The aortic arch and proximal common carotid arteries were not imaged. The included portion of the common carotid and cervical internal carotid arteries are patent without evidence of significant stenosis. Minimal narrowing of the distal right cervical ICA is similar to the prior neck CTA. The vertebral arteries are patent with antegrade flow bilaterally. The left vertebral artery is dominant without evidence of stenosis or dissection. No significant right vertebral artery stenosis is identified although the proximal V1 segment is suboptimally evaluated. IMPRESSION: 1. No large vessel occlusion. 2. Progressive intracranial atherosclerosis with severe stenoses involving the right P2 segment and right ACA and right MCA origins. Moderate right ICA supraclinoid and terminus stenoses. 3. Patent carotid and vertebral arteries in the neck without significant stenosis identified within mild limitations as detailed above. Electronically Signed   By: Logan Bores M.D.   On: 01/19/2018 10:55   Mr Brain Wo Contrast  Result Date: 01/19/2018 CLINICAL DATA:  57 y/o M; unwitnessed fall and ataxia. Difficulty walking for the past few weeks and increased falls. Ataxia, stroke suspected. EXAM: MRI HEAD WITHOUT CONTRAST TECHNIQUE: Multiplanar, multiecho pulse sequences of the brain and surrounding structures were obtained without intravenous contrast. COMPARISON:  01/18/2018 CT head.  04/15/2016 MRI head. FINDINGS: Brain: 3 punctate foci of reduced diffusion are present within the right inferior thalamus and right posterior hippocampus compatible with acute/early subacute infarction. No associated hemorrhage or mass effect. Stable large chronic infarct in the right MCA distribution, right anterior basal ganglia chronic lacunar infarct, and small  chronic infarct in the right hemi pons with hemosiderin deposition. No extra-axial collection, hydrocephalus, focal mass effect, or herniation. Ex vacuo dilatation of the right lateral ventricle. Vascular: Normal flow voids. Skull and upper cervical spine: Normal marrow signal. Sinuses/Orbits: Mild diffuse paranasal sinus mucosal thickening. Partial opacification of the left mastoid air cells. Left intra-ocular lens replacement. Other: None. IMPRESSION: 1. 3 punctate foci of acute/early subacute infarction in the right inferior thalamus and right posterior hippocampus. No associated hemorrhage or mass effect. 2. Stable chronic infarcts in the right MCA distribution, right anterior basal ganglia, and right hemi pons. 3. Mild paranasal sinus disease. These results will be called to the ordering clinician or representative by the Radiologist Assistant, and communication documented in the PACS or zVision Dashboard. Electronically Signed   By: Kristine Garbe M.D.   On: 01/19/2018 02:49   US Abdomen Complete  Result Date: 01/19/2018 CLINICAL DATA:  Acute renal failure  with nausea and vomiting EXAM: ABDOMEN ULTRASOUND COMPLETE COMPARISON:  None. FINDINGS: Gallbladder: No gallstones or wall thickening visualized. No sonographic Murphy sign noted by sonographer. Common bile duct: Diameter: 3 mm Liver: No focal lesion identified. Within normal limits in parenchymal echogenicity. Portal vein is patent on color Doppler imaging with normal direction of blood flow towards the liver. IVC: No abnormality visualized. Pancreas: Visualized portion unremarkable. Spleen: Size and appearance within normal limits. Right Kidney: Length: 11.5 cm. Echogenicity within normal limits. No mass or hydronephrosis visualized. Left Kidney: Length: 10 cm. Echogenicity within normal limits. No mass or hydronephrosis visualized. Abdominal aorta: No aneurysm visualized. IMPRESSION: Negative abdominal ultrasound. Electronically Signed    By: Monte Fantasia M.D.   On: 01/19/2018 12:49    Scheduled Meds: .  stroke: mapping our early stages of recovery book   Does not apply Once  . aspirin  300 mg Rectal Daily   Or  . aspirin  325 mg Oral Daily  . atorvastatin  80 mg Oral q1800  . brimonidine  1 drop Both Eyes TID  . brinzolamide  1 drop Both Eyes TID  . clopidogrel  75 mg Oral Daily  . heparin  5,000 Units Subcutaneous Q8H  . insulin aspart  0-9 Units Subcutaneous TID WC  . timolol  1 drop Both Eyes Daily   Continuous Infusions: . lactated ringers 125 mL/hr at 01/19/18 0427     LOS: 1 day   Marylu Lund, MD [facility] Pager On Amion  If 7PM-7AM, please contact night-coverage 01/19/2018, 2:56 PM

## 2018-01-19 NOTE — Progress Notes (Signed)
  New Admission Note:   Arrival Method: Stretcher Mental Orientation: A&O X4 Telemetry: Placed on patient, central Tele called Assessment: Completed Skin: WDL, intact IV: L UA Pain: Denies Safety Measures: Safety Fall Prevention Plan has been given, discussed and signed Admission History: Initiated  Unit Orientation: Patient has been orientated to the room, unit and staff.   Awaiting admission orders. Will continue to monitor the patient. Call light has been placed within reach and bed alarm has been activated.

## 2018-01-19 NOTE — Evaluation (Signed)
Physical Therapy Evaluation Patient Details Name: Cory Carpenter MRN: [id number] DOB: 08-08-1960 Today's Date: 01/19/2018   History of Present Illness  Pt adm with nausea, vomiting, diarrhea and acute renal failure. Pt with recent onset of frequent falls and MRI revealed 3 small punctate acute/early subacute strokes in the right thalamus. PMH - Rt CVA 12/2015 and 03/2016, DM, glaucoma, narcolepsy  Clinical Impression  Pt presents to PT with gait disturbance and cognitive deficits. Needs PT to address these problems and work toward [place] and safety at home. If wife can provide 24 hour assist at home recommend OPPT. If wife unable to provide may need to look at other post acute options.     Follow Up Recommendations Outpatient PT;Supervision/Assistance - 24 hour    Equipment Recommendations  Other (comment)(To be determined)    Recommendations for Other Services       Precautions / Restrictions Precautions Precautions: Fall      Mobility  Bed Mobility Overal bed mobility: Needs Assistance Bed Mobility: Supine to Sit     Supine to sit: Supervision     General bed mobility comments: Pt began sitting up and putting legs over bed rail  Transfers Overall transfer level: Needs assistance Equipment used: Straight cane Transfers: Sit to/from Stand Sit to Stand: Min guard         General transfer comment: Assist for balance and safety  Ambulation/Gait Ambulation/Gait assistance: Min guard Gait Distance (Feet): 275 Feet Assistive device: Straight cane Gait Pattern/deviations: Step-through pattern;Decreased stride length Gait velocity: decr Gait velocity interpretation: 1.31 - 2.62 ft/sec, indicative of limited community ambulator General Gait Details: Assist for balance and safety. Pt slightly unsteady but no overt loss of balance.  Stairs            Wheelchair Mobility    Modified Rankin (Stroke Patients Only) Modified Rankin (Stroke Patients  Only) Pre-Morbid Rankin Score: Slight disability Modified Rankin: Moderately severe disability     Balance Overall balance assessment: Needs assistance Sitting-balance support: No upper extremity supported;Feet supported Sitting balance-Leahy Scale: Good     Standing balance support: No upper extremity supported;During functional activity Standing balance-Leahy Scale: Fair                               Pertinent Vitals/Pain Pain Assessment: No/denies pain    Home Living Family/patient expects to be discharged to:: Private residence Living Arrangements: Spouse/significant other Available Help at Discharge: Family;Available 24 hours/day Type of Home: House Home Access: Stairs to enter Entrance Stairs-Rails: None Entrance Stairs-Number of Steps: 3 Home Layout: One level Home Equipment: Cane - single point      Prior Function Level of Independence: Needs assistance   Gait / Transfers Assistance Needed: modified independent with cane. Recent increase in falls.     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, newspaper   Dominant Hand: Right    Extremity/Trunk Assessment   Upper Extremity Assessment Upper Extremity Assessment: Defer to OT evaluation    Lower Extremity Assessment Lower Extremity Assessment: Generalized weakness       Communication      Cognition Arousal/Alertness: Awake/alert Behavior During Therapy: Impulsive Overall Cognitive Status: No family/caregiver present to determine baseline cognitive functioning Area of Impairment: Memory;Safety/judgement;Problem solving                     Memory: Decreased short-term memory   Safety/Judgement: Decreased awareness of safety;Decreased awareness of deficits  Problem Solving: Requires verbal cues;Requires tactile cues        General Comments      Exercises     Assessment/Plan    PT Assessment Patient needs continued PT services  PT Problem List Decreased  strength;Decreased balance;Decreased mobility;Decreased cognition;Decreased safety awareness       PT Treatment Interventions DME instruction;Gait training;Stair training;Functional mobility training;Therapeutic activities;Therapeutic exercise;Balance training;Patient/family education;Cognitive remediation    PT Goals (Current goals can be found in the Care Plan section)  Acute Rehab PT Goals Patient Stated Goal: return home PT Goal Formulation: With patient Time For Goal Achievement: 01/26/18 Potential to Achieve Goals: Good    Frequency Min 4X/week   Barriers to discharge Inaccessible home environment stairs to enter    Co-evaluation               AM-PAC PT "6 Clicks" Daily Activity  Outcome Measure Difficulty turning over in bed (including adjusting bedclothes, sheets and blankets)?: None Difficulty moving from lying on back to sitting on the side of the bed? : None Difficulty sitting down on and standing up from a chair with arms (e.g., wheelchair, bedside commode, etc,.)?: Unable Help needed moving to and from a bed to chair (including a wheelchair)?: A Little Help needed walking in hospital room?: A Little Help needed climbing 3-5 steps with a railing? : A Little 6 Click Score: 18    End of Session Equipment Utilized During Treatment: Gait belt Activity Tolerance: Patient tolerated treatment well Patient left: in chair;with call bell/phone within reach;with chair alarm set Nurse Communication: Mobility status PT Visit Diagnosis: Unsteadiness on feet (R26.81);Other abnormalities of gait and mobility (R26.89)    Time: 5093-2671 PT Time Calculation (min) (ACUTE ONLY): 19 min   Charges:   PT Evaluation $PT Eval Moderate Complexity: [place] Pager [phone] Office [place] 01/19/2018, 2:56 PM

## 2018-01-19 NOTE — Consult Note (Signed)
Neurology Consultation  Reason for Consult: Acute/subacute stroke Referring Physician: Dr. Hal Hope  CC: Falls  History is obtained from: Patient, chart  HPI: Cory Carpenter is a 57 y.o. male past medical history of diabetes, hypertension, sleep apnea, stroke in the right MCA territory as well as an acute stroke in the right pons in 2018 with residual coordination deficits, presents to the emergency room for evaluation of multiple falls in the past few days.  The most current fall that brought him in happened about couple of hours prior to presentation to the ED which was around 6 PM on 01/18/2018.  According to the chart review, his wife told the ED providers that he has been having more difficulty walking over the past few weeks and is falling more. He is on aspirin and Plavix. He has not been taking his insulin regularly because he ran out of needles. Initial lab work revealed elevation of creatinine from a baseline of 1.1-9.2 with elevated anion gap and a normal pH on VBG.  He was given 1 L of fluids and admitted for work-up of AKI. MRI was done as a part of work-up for these falls and discoordination which revealed 3 small punctate acute/early subacute strokes in the right thalamus. Neurological consultation was obtained for strokes.  LKW: Unclear-about 2 to 3 weeks ago. tpa given?: no, outside the window Premorbid modified Rankin scale (mRS): 2  ROS: ROS was performed and is negative except as noted in the HPI.  Past Medical History:  Diagnosis Date  . Diabetes mellitus without complication ([place])   . Glaucoma   . Hypertension   . Narcolepsy   . Sleep apnea   . Stroke [facility])     Family History  Problem Relation Age of Onset  . Diabetes Mellitus II Mother   . Kidney disease Mother   . Stroke Mother    Social History:   reports that he has quit smoking. He has never used smokeless tobacco. He reports that he drinks alcohol. He reports that he does not use  drugs.  Medications  Current Facility-Administered Medications:  .   stroke: mapping our early stages of recovery book, , Does not apply, Once, Rise Patience, MD .  acetaminophen (TYLENOL) tablet 650 mg, 650 mg, Oral, Q6H PRN **OR** acetaminophen (TYLENOL) suppository 650 mg, 650 mg, Rectal, Q6H PRN, Rise Patience, MD .  aspirin EC tablet 81 mg, 81 mg, Oral, Daily, Rise Patience, MD .  aspirin suppository 300 mg, 300 mg, Rectal, Daily **OR** aspirin tablet 325 mg, 325 mg, Oral, Daily, Gean Birchwood N, MD .  heparin injection 5,000 Units, 5,000 Units, Subcutaneous, Q8H, Gean Birchwood N, MD .  hydrALAZINE (APRESOLINE) injection 5 mg, 5 mg, Intravenous, Q4H PRN, Rise Patience, MD .  insulin aspart (novoLOG) injection 0-9 Units, 0-9 Units, Subcutaneous, TID WC, Rise Patience, MD .  lactated ringers infusion, , Intravenous, Continuous, Rise Patience, MD, Last Rate: 125 mL/hr at 01/19/18 0427  Exam: Current vital signs: BP (!) 137/111 (BP Location: Right Arm)   Pulse (!) 52   Temp 97.7 F (36.5 C) (Oral)   Resp 20   Ht 5\' 9"  (1.753 m)   Wt 94.8 kg   SpO2 94%   BMI 30.86 kg/m  Vital signs in last 24 hours: Temp:  [97.7 F (36.5 C)-99 F (37.2 C)] 97.7 F (36.5 C) (11/21 2233) Pulse Rate:  [52-67] 52 (11/21 2233) Resp:  [16-27] 20 (11/21 2233) BP: (117-147)/(57-125) 137/111 (11/21 2233)  SpO2:  [94 %-98 %] 94 % (11/21 2233) Weight:  [94.8 kg] 94.8 kg (11/21 1809) GENERAL: Awake, alert in NAD HEENT: - Normocephalic and atraumatic, dry mm, no LN++, no Thyromegally LUNGS - Clear to auscultation bilaterally with no wheezes CV - S1S2 RRR, no m/r/g, equal pulses bilaterally. ABDOMEN - Soft, nontender, nondistended with normoactive BS Ext: warm, well perfused, intact peripheral pulses, no edema edema  NEURO:  Mental Status: AA&Ox3  Language: speech is mildly dysarthric.  Naming, repetition, fluency, and comprehension intact. Cranial  Nerves: PERRL. EOMI, visual fields full, no facial asymmetry, facial sensation intact, hearing intact, tongue/uvula/soft palate midline, normal sternocleidomastoid and trapezius muscle strength. No evidence of tongue atrophy or fibrillations Motor: 5/5 with no vertical drift Tone: is normal and bulk is normal Sensation- Intact to light touch bilaterally Coordination: FTN intact bilaterally Gait- deferred  NIHSS-1 for mild dysarthria  Labs I have reviewed labs in epic and the results pertinent to this consultation are:  CBC    Component Value Date/Time   WBC 6.5 01/19/2018 0059   RBC 3.94 (L) 01/19/2018 0059   HGB 11.8 (L) 01/19/2018 0059   HCT 36.1 (L) 01/19/2018 0059   PLT 245 01/19/2018 0059   MCV 91.6 01/19/2018 0059   MCH 29.9 01/19/2018 0059   MCHC 32.7 01/19/2018 0059   RDW 11.1 (L) 01/19/2018 0059   LYMPHSABS 2.0 01/18/2018 1715   MONOABS 0.6 01/18/2018 1715   EOSABS 0.1 01/18/2018 1715   BASOSABS 0.0 01/18/2018 1715    CMP     Component Value Date/Time   NA 138 01/18/2018 1818   K 4.0 01/18/2018 1818   CL 94 (L) 01/18/2018 1818   CO2 24 01/18/2018 1818   GLUCOSE 166 (H) 01/18/2018 1818   BUN 84 (H) 01/18/2018 1818   CREATININE 9.02 (H) 01/19/2018 0059   CALCIUM 9.2 01/18/2018 1818   PROT 8.2 (H) 01/18/2018 1818   ALBUMIN 4.1 01/18/2018 1818   AST 22 01/18/2018 1818   ALT 12 01/18/2018 1818   ALKPHOS 75 01/18/2018 1818   BILITOT 0.7 01/18/2018 1818   GFRNONAA 6 (L) 01/19/2018 0059   GFRAA 7 (L) 01/19/2018 0059   Imaging I have reviewed the images obtained: CT-scan of the brain= no acute changes.  Remote right pontine and right MCA distributional infarct.  MRI examination of the brain-3 punctate foci of early subacute versus acute infarction in the right inferior thalamus and right posterior hippocampus with no associated hemorrhage or mass-effect.  Stable chronic infarcts in the right MCA distribution, right anterior basal ganglia and right  hemi-pons.  Assessment:  57 year old man with a history of prior strokes in the right MCA territory due to right MCA stenosis, right pontine stroke, hypertension, diabetes, sleep apnea, presenting for multiple days to 3 weeks history of increasing falls and ataxia. MRI reveals 3 punctate areas of acute/early subacute infarcts Also has AKI with creatinine increased from baseline of 1 to the most recent lab value showing creatinine of 9. Not clear if the strokes have anything to do with his current presentation or are merely incidental but he has enough risk factors to warrant a full stroke work-up.  Impression: Acute ischemic stroke-likely small vessel AKI  Recommendations: Continue with aspirin Plavix for now Continue with atorvastatin 80 Telemetry MRA head and neck without contrast given deranged renal function 2D echocardiogram Hemoglobin A 1C Fasting lipid panel Frequent neurochecks PT/OT/speech therapy Stroke team to follow in the morning  Please page stroke NP/PA/MD (listed on AMION)  from  8am-4 pm as this patient will be followed by the stroke team at this point.  -- Amie Portland, MD [facility] Pager: [phone] If 7pm to 7am, please call on call as listed on AMION.

## 2018-01-19 NOTE — Progress Notes (Signed)
STROKE TEAM PROGRESS NOTE   SUBJECTIVE (INTERVAL HISTORY) His wife is at the bedside.  Pt is lying in bed, sleepy but easily arousable and answers questions appropriately.  He stated that he has diarrhea and vomiting for the last several days for which he may be dehydrated.  He also takes HCTZ and lisinopril for BP control and metformin for diabetes, which also likely causing her kidney function insufficiency.  He also on aspirin and Brilinta which was prescribed by his [place] doctor, however that was not on his medication list here in epic.  His creatinine decreased after IV fluid.   OBJECTIVE Vitals:   01/18/18 1958 01/18/18 2233 01/19/18 0503 01/19/18 1041  BP: (!) 147/125 (!) 137/111 (!) 142/43 (!) 92/48  Pulse: 62 (!) 52 66 64  Resp: 16 20 19 14   Temp:  97.7 F (36.5 C) 98.9 F (37.2 C) 98.7 F (37.1 C)  TempSrc:  Oral Oral Axillary  SpO2: 98% 94% 95% 93%  Weight:    85.4 kg  Height:    5\' 9"  (1.753 m)    CBC:  Recent Labs  Lab 01/18/18 1715 01/19/18 0059 01/19/18 0634  WBC 7.3 6.5 5.4  NEUTROABS 4.6  --  2.5  HGB 13.3 11.8* 11.3*  HCT 40.2 36.1* 33.9*  MCV 91.8 91.6 90.9  PLT 273 245 588    Basic Metabolic Panel:  Recent Labs  Lab 01/18/18 1818 01/19/18 0059 01/19/18 0634  NA 138  --  138  K 4.0  --  3.4*  CL 94*  --  97*  CO2 24  --  26  GLUCOSE 166*  --  205*  BUN 84*  --  78*  CREATININE 9.20* 9.02* 8.68*  CALCIUM 9.2  --  8.2*  PHOS  --  5.1*  --     Lipid Panel:     Component Value Date/Time   CHOL 63 01/19/2018 0634   TRIG 234 (H) 01/19/2018 0634   HDL 17 (L) 01/19/2018 0634   CHOLHDL 3.7 01/19/2018 0634   VLDL 47 (H) 01/19/2018 0634   LDLCALC NEG 1 01/19/2018 0634   HgbA1c:  Lab Results  Component Value Date   HGBA1C 10.7 (H) 01/19/2018   Urine Drug Screen:     Component Value Date/Time   LABOPIA NONE DETECTED 04/15/2016 1559   COCAINSCRNUR NONE DETECTED 04/15/2016 1559   LABBENZ NONE DETECTED 04/15/2016 1559   AMPHETMU NONE DETECTED  04/15/2016 1559   THCU NONE DETECTED 04/15/2016 1559   LABBARB NONE DETECTED 04/15/2016 1559    Alcohol Level     Component Value Date/Time   ETH <10 01/18/2018 1818    IMAGING  Ct Head Wo Contrast 01/18/2018 IMPRESSION:  Remote infarcts of the right pons and MCA distribution. No acute intracranial abnormality.    [name] Head Wo Contrast [name] Neck Wo Contrast 01/19/2018 IMPRESSION:  1. No large vessel occlusion.  2. Progressive intracranial atherosclerosis with severe stenoses involving the right P2 segment and right ACA and right MCA origins. Moderate right ICA supraclinoid and terminus stenoses.  3. Patent carotid and vertebral arteries in the neck without significant stenosis identified within mild limitations as detailed above.    Mr Brain Wo Contrast 01/19/2018 IMPRESSION:  1. 3 punctate foci of acute/early subacute infarction in the right inferior thalamus and right posterior hippocampus. No associated hemorrhage or mass effect.  2. Stable chronic infarcts in the right MCA distribution, right anterior basal ganglia, and right hemi pons.  3. Mild paranasal sinus  disease.    US Abdomen Complete 01/19/2018 IMPRESSION:  Negative abdominal ultrasound.   Transthoracic Echocardiogram - pending 00/00/00    PHYSICAL EXAM  Temp:  [97.7 F (36.5 C)-99 F (37.2 C)] 98.7 F (37.1 C) (11/22 1041) Pulse Rate:  [52-67] 64 (11/22 1041) Resp:  [14-27] 14 (11/22 1041) BP: (92-147)/(43-125) 92/48 (11/22 1041) SpO2:  [93 %-98 %] 93 % (11/22 1041) Weight:  [85.4 kg-94.8 kg] 85.4 kg (11/22 1041)  General - Well nourished, well developed, sleepy but easily arousable.  Ophthalmologic - fundi not visualized due to noncooperation.  Cardiovascular - Regular rate and rhythm.  Mental Status -  Level of arousal and orientation to time, place, and person were intact. Language including expression, naming, repetition, comprehension was assessed and found intact. Fund of  Knowledge was assessed and was intact.  Cranial Nerves II - XII - II - Visual field intact OD, left eye visual acuity only has light perception. III, IV, VI - Extraocular movements intact. V - Facial sensation intact bilaterally. VII - Facial movement intact bilaterally. VIII - Hearing & vestibular intact bilaterally. X - Palate elevates symmetrically. XI - Chin turning & shoulder shrug intact bilaterally. XII - Tongue protrusion intact.  Motor Strength - The patient's strength was normal in all extremities and pronator drift was absent.  Bulk was normal and fasciculations were absent.   Motor Tone - Muscle tone was assessed at the neck and appendages and was normal.  Reflexes - The patient's reflexes were symmetrical in all extremities and he had no pathological reflexes.  Sensory - Light touch, temperature/pinprick were assessed and were symmetrical.    Coordination - The patient had normal movements in the hands and feet with no ataxia or dysmetria.  Tremor was absent.  Gait and Station - deferred.   ASSESSMENT/PLAN Mr. Jozsef Wescoat is a 57 y.o. male with history of diabetes, hypertension, sleep apnea, stroke in the right MCA territory as well as an acute stroke in the right pons in 2018 presenting with multiple falls. He did not receive IV t-PA due to late presentation.  Stroke: Right inferior thalamus and right posterior hippocampus 3 punctate infarcts infarcts -likely due to small vessel disease in the setting of severe renal insufficiency and dehydration.  CT head - Remote infarcts of the right pons and MCA distribution.   MRI head - 3 punctate foci of acute/early subacute infarction in the right inferior thalamus and right posterior hippocampus. Stable chronic infarcts.  MRA head and neck- No large vessel occlusion. Progressive intracranial atherosclerosis with severe stenoses involving the right P2 segment and right ACA and right MCA origins. Moderate right ICA supraclinoid  and terminus stenoses.  2D Echo - pending  LDL - Neg 1  HgbA1c - 10.7  UDS - pending  VTE prophylaxis - [place] Heparin  Diet - renal carb modified  aspirin 81 mg daily and Brilinta 90 mg twice daily prior to admission, now on aspirin 81 mg daily and Brilinta 90 mg twice daily which are what his [place] doctor prescribed for him.  Patient counseled to be compliant with his antithrombotic medications  Ongoing aggressive stroke risk factor management  Therapy recommendations:  Outpt PT and OT  Disposition:  Pending  Severe renal insufficiency - ?? Severe AKI  Baseline creatinine 1.1, however, 9.2 on admission  Likely multifactorial, including nausea vomiting with diarrhea, severe dehydration, taking HCTZ, lisinopril and metformin at home.  On aggressive IV fluid infusion  Repeat creatinine 8.68  Continue IV fluid and close  monitoring  History of stroke  12/2015 admitted for left facial droop with slurred speech and altered mental status as well as left arm weakness.  MRI showed right MCA cortical and subcortical acute infarct but also old right MCA infarcts.Marland Kitchen  MRA head severe right M1 right siphon and left M2 stenosis.  CTA neck showed left ICA soft plaque.  EF 60 to 65%.  LDL 27 A1c 14.2.  He was put on DAPT for 3 months.  In 03/2016 he was admitted again for imbalance and slurred speech.  MRI showed right pontine infarct.  MRA showed severe right siphon and right MCA stenosis.  Carotid Doppler negative.  EF 60 to 65%.  LDL 15 and A1c 7.9.  He was put on DAPT and Lipitor 80 and recommended 30-day cardio event monitoring as outpatient.  He has been following with his [place] in [place], for whatever reason, his DAPT was changed from aspirin and the Plavix to aspirin and Brilinta.  Hypertension  Stable but occassionally low . Permissive hypertension (OK if < 220/120) but gradually normalize in 5-7 days . Long-term BP goal normotensive  Hyperlipidemia  Lipid lowering  medication PTA:  Lipitor 80 mg daily  LDL neg 1, goal < 70  Hold off lipitor for now given extremely low LDL  May consider low-dose statin at discharge  Diabetes  HgbA1c 10.7, goal < 7.0  Uncontrolled  SSI  CBG monitoring  Close PCP follow-up for better DM control  Other Stroke Risk Factors  Former cigarette smoker - quit  ETOH use, advised to drink no more than 1 alcoholic beverage per day.  Family hx stroke (mother)  Obstructive sleep apnea  Other Active Problems  Mild hypokalemia  Hospital day # 1  Rosalin Hawking, MD PhD Stroke Neurology 01/19/2018 7:45 PM    To contact Stroke Continuity provider, please refer to [email or web address] After hours, contact General Neurology

## 2018-01-19 NOTE — Progress Notes (Signed)
SLP Cancellation Note  Patient Details Name: Cory Carpenter MRN: [id number] DOB: 1960/11/05   Cancelled treatment:       Reason Eval/Treat Not Completed: Patient at procedure or test/unavailable. Will f/u for cognitive-linguistic evaluation as able.   Germain Osgood 01/19/2018, 1:07 PM  Germain Osgood, M.A. [place] Acute Environmental education officer [phone] Office [phone]

## 2018-01-19 NOTE — Evaluation (Signed)
Occupational Therapy Evaluation Patient Details Name: Cory Carpenter MRN: [id number] DOB: 06-Nov-1960 Today's Date: 01/19/2018    History of Present Illness Pt adm with nausea, vomiting, diarrhea and acute renal failure. Pt with recent onset of frequent falls and MRI revealed 3 small punctate acute/early subacute strokes in the right thalamus. PMH - Rt CVA 12/2015 and 03/2016, DM, glaucoma, narcolepsy   Clinical Impression   Pt reports walking with a cane, sitting to shower and having intermittent assist for LB ADL. His wife takes care of IADL. No family available to report on pt's baseline cognition, but demonstrating decreased safety awareness, impulsivity and slow processing. Pt has residual weakness and incoordination in L UE from previous CVAs. Pt currently requires up to min assist for ADL and min guard assist for ambulation with cane. Recommending OPOT with 24 hour care at home. Will follow acutely.    Follow Up Recommendations  Outpatient OT, 24 hour supervision   Equipment Recommendations  None recommended by OT    Recommendations for Other Services       Precautions / Restrictions Precautions Precautions: Fall Restrictions Weight Bearing Restrictions: No      Mobility Bed Mobility      General bed mobility comments: pt seated in chair  Transfers Overall transfer level: Needs assistance Equipment used: Straight cane Transfers: Sit to/from Stand Sit to Stand: Min guard         General transfer comment: Assist for balance and safety, pt attempting to stand from recliner without putting foot rest down in order to spit in sink    Balance Overall balance assessment: Needs assistance Sitting-balance support: No upper extremity supported;Feet supported Sitting balance-Leahy Scale: Good     Standing balance support: No upper extremity supported;During functional activity Standing balance-Leahy Scale: Fair Standing balance comment: can stand statically at sink  without support or LOB                           ADL either performed or assessed with clinical judgement   ADL Overall ADL's : Needs assistance/impaired Eating/Feeding: Set up;Sitting Eating/Feeding Details (indicate cue type and reason): pt likes jello and cranberry juice Grooming: Wash/dry hands;Standing;Min guard   Upper Body Bathing: Minimal assistance;Sitting   Lower Body Bathing: Minimal assistance;Sit to/from stand   Upper Body Dressing : Minimal assistance;Sitting   Lower Body Dressing: Minimal assistance;Sit to/from stand   Toilet Transfer: Min guard;Ambulation(cane)   Toileting- Clothing Manipulation and Hygiene: Min guard(standing)       Functional mobility during ADLs: Min guard;Cane       Vision Patient Visual Report: No change from baseline Additional Comments: pt with field deficits in L eye from previous stroke     Perception     Praxis      Pertinent Vitals/Pain Pain Assessment: No/denies pain     Hand Dominance Right   Extremity/Trunk Assessment Upper Extremity Assessment Upper Extremity Assessment: LUE deficits/detail LUE Deficits / Details: decreased coordination, slow, uses a functional assist LUE Coordination: decreased fine motor;decreased gross motor   Lower Extremity Assessment Lower Extremity Assessment: Defer to PT evaluation       Communication Communication Communication: No difficulties   Cognition Arousal/Alertness: Awake/alert Behavior During Therapy: Impulsive Overall Cognitive Status: No family/caregiver present to determine baseline cognitive functioning Area of Impairment: Memory;Safety/judgement;Problem solving                     Memory: Decreased short-term memory   Safety/Judgement: Decreased awareness  of safety;Decreased awareness of deficits   Problem Solving: Requires verbal cues;Requires tactile cues     General Comments       Exercises     Shoulder Instructions      Home  Living Family/patient expects to be discharged to:: Private residence Living Arrangements: Spouse/significant other Available Help at Discharge: Family;Available 24 hours/day Type of Home: House Home Access: Stairs to enter [employer] of Steps: 3 Entrance Stairs-Rails: None Home Layout: One level     Bathroom Shower/Tub: Teacher, early years/pre: Standard     Home Equipment: Cane - single point;Shower seat          Prior Functioning/Environment Level of Independence: Needs assistance  Gait / Transfers Assistance Needed: modified independent with cane. Recent increase in falls. ADL's / Homemaking Assistance Needed: sometimes needing assist for LB dressing, wife does meal prep and housekeeping   Comments: Pt no longer works        OT Problem List: Decreased strength;Decreased activity tolerance;Impaired balance (sitting and/or standing);Decreased coordination;Decreased cognition;Decreased safety awareness;Decreased knowledge of use of DME or AE;Impaired UE functional use      OT Treatment/Interventions: Self-care/ADL training;DME and/or AE instruction;Cognitive remediation/compensation;Patient/family education;Balance training;Therapeutic activities    OT Goals(Current goals can be found in the care plan section) Acute Rehab OT Goals Patient Stated Goal: return home OT Goal Formulation: With patient Time For Goal Achievement: 02/02/18 Potential to Achieve Goals: Good ADL Goals Pt Will Perform Grooming: with supervision;standing Pt Will Perform Upper Body Bathing: with supervision;sitting Pt Will Perform Lower Body Bathing: with supervision;sit to/from stand Pt Will Perform Upper Body Dressing: with supervision;sitting Pt Will Perform Lower Body Dressing: with supervision;sit to/from stand Pt Will Transfer to Toilet: with supervision;ambulating;regular height toilet Pt Will Perform Toileting - Clothing Manipulation and hygiene: with supervision;sit  to/from stand  OT Frequency: Min 2X/week   Barriers to D/C:            Co-evaluation              AM-PAC PT "6 Clicks" Daily Activity     Outcome Measure Help from another person eating meals?: A Little Help from another person taking care of personal grooming?: A Little Help from another person toileting, which includes using toliet, bedpan, or urinal?: A Little Help from another person bathing (including washing, rinsing, drying)?: A Little Help from another person to put on and taking off regular upper body clothing?: A Little Help from another person to put on and taking off regular lower body clothing?: A Little 6 Click Score: 18   End of Session Equipment Utilized During Treatment: Gait belt(cane) Nurse Communication: (assisted pt in ordering dinner tray)  Activity Tolerance: Patient tolerated treatment well Patient left: in chair;with call bell/phone within reach;with chair alarm set  OT Visit Diagnosis: Unsteadiness on feet (R26.81);Other abnormalities of gait and mobility (R26.89);Hemiplegia and hemiparesis;Other symptoms and signs involving cognitive function;Muscle weakness (generalized) (M62.81) Hemiplegia - Right/Left: Left Hemiplegia - dominant/non-dominant: Non-Dominant Hemiplegia - caused by: Cerebral infarction                Time: 1450-1508 OT Time Calculation (min): 18 min Charges:  OT General Charges $OT Visit: 1 Visit OT Evaluation $OT Eval Moderate Complexity: 1 Mod  Nestor Lewandowsky, OTR/L Acute Rehabilitation Services Pager: [phone] Office: [phone]  Malka So 01/19/2018, 3:37 PM

## 2018-01-20 ENCOUNTER — Inpatient Hospital Stay (HOSPITAL_COMMUNITY): Payer: Federal, State, Local not specified - PPO

## 2018-01-20 DIAGNOSIS — I639 Cerebral infarction, unspecified: Secondary | ICD-10-CM

## 2018-01-20 LAB — GLUCOSE, CAPILLARY
GLUCOSE-CAPILLARY: 343 mg/dL — AB (ref 70–99)
GLUCOSE-CAPILLARY: 268 mg/dL — AB (ref 70–99)
Glucose-Capillary: 326 mg/dL — ABNORMAL HIGH (ref 70–99)
Glucose-Capillary: 301 mg/dL — ABNORMAL HIGH (ref 70–99)

## 2018-01-20 LAB — BASIC METABOLIC PANEL
ANION GAP: 12 (ref 5–15)
BUN: 73 mg/dL — ABNORMAL HIGH (ref 6–20)
CALCIUM: 8.3 mg/dL — AB (ref 8.9–10.3)
CO2: 28 mmol/L (ref 22–32)
CREATININE: 6.37 mg/dL — AB (ref 0.61–1.24)
Chloride: 93 mmol/L — ABNORMAL LOW (ref 98–111)
GFR calc Af Amer: 10 mL/min — ABNORMAL LOW (ref >60)
GFR calc non Af Amer: 9 mL/min — ABNORMAL LOW (ref >60)
Glucose, Bld: 418 mg/dL — ABNORMAL HIGH (ref 70–99)
Potassium: 3.6 mmol/L (ref 3.5–5.1)
Sodium: 133 mmol/L — ABNORMAL LOW (ref 135–145)

## 2018-01-20 LAB — ECHOCARDIOGRAM COMPLETE
HEIGHTINCHES: 69 in
Weight: 3012.8 oz

## 2018-01-20 LAB — MAGNESIUM: MAGNESIUM: 2 mg/dL (ref 1.7–2.4)

## 2018-01-20 MED ORDER — INSULIN GLARGINE 100 UNIT/ML ~~LOC~~ SOLN
10.0000 [IU] | Freq: Every day | SUBCUTANEOUS | Status: DC
  Administered 2018-01-20 – 2018-01-21 (×2): 10 [IU] via SUBCUTANEOUS
  Filled 2018-01-20 (×3): qty 0.1

## 2018-01-20 MED ORDER — LACTATED RINGERS IV SOLN
INTRAVENOUS | Status: DC
  Administered 2018-01-20 – 2018-01-22 (×3): via INTRAVENOUS

## 2018-01-20 NOTE — Progress Notes (Signed)
  Echocardiogram 2D Echocardiogram has been performed.  Merrie Roof F 01/20/2018, 1:48 PM

## 2018-01-20 NOTE — Progress Notes (Signed)
PROGRESS NOTE    Cory Carpenter  HYI:[id number] DOB: Mar 23, 1960 DOA: 01/18/2018 PCP: [facility]    Brief Narrative:  57 y.o. male with history of stroke in February 2018, diabetes mellitus type 2, hypertension, chronic disease, sleep apnea and narcolepsy on CPAP has been feeling increasingly difficult to walk with balance issues over the last 3 weeks.  Patient states he has been having some gait difficulties since his last stroke in February 2018 but over the last 3 weeks it is acutely worsened.  He has been taking care of his wife who has been treated for flu and bronchitis for the last few days.  Patient states over the last week and half has been having nausea vomiting and diarrhea vomiting is at least 4-5 episodes daily and diarrhea at least 2 episodes daily.  Denies any blood in the diarrhea or vomitus.  Has been having some hiccups.  Also has had at least 6 falls in the last 2 weeks.  Denies losing consciousness but has had times at his head.  ED Course: In the ER patient appears generally weak.  Difficult to walk.  Labs show acute renal failure with creatinine of 9 with urine showing mild proteinuria otherwise no casts.  Patient states he is making urine.  CT of the head was unremarkable.  On my exam patient able to move all extremities but appears a little bit ataxic.  Patient was given Ringer lactate 1 L fluid bolus and admitted for acute renal failure and further work-up of his difficulty with walking.  Assessment & Plan:   Principal Problem:   Acute renal failure (ARF) (HCC) Active Problems:   Essential hypertension   DM (diabetes mellitus), secondary, uncontrolled, w/neurologic complic (HCC)   Ataxia   Nausea vomiting and diarrhea   Cerebral thrombosis with cerebral infarction  1. Acute renal failure 1. Cr of over 9 at presentation 2. Patient presented with n/v and poor po intake in setting of HCTZ and Lisinopril 3. Consider pre-renal azotemia vs ATN 4. Patient  continues to void, does not appear to be uremic 5. Tolerating IVF hydration 6. Continue indwelling cath for strict I/O 7. If renal function worsens or if patient becomes oliguric, would consult Nephrology at that time 8. Not volume overloaded, electrolytes unremarkable 9. Avoid nephrotoxic agents 2. Ataxia and difficulty with gait with acute CVA 1. Patient underwent MRI brain with findings of acute CVA 2. Seen by Neurology. Discussed with Dr. Erlinda Hong. Plan to continue ASA and brilinta 3. Diabetes mellitus type 2 1. Long-acting insulin held secondary to worsening renal function 2. Continue SSI coverage 3. Glucose in the 300's 4. Have started low dose lantus at 10 units 4. Nausea vomiting and diarrhea  1. Continue supportive care for now with IVF 2. No further episodes noted 5. History of sleep apnea and narcolepsy on CPAP. 6. Hypertension 1. Continue on PRN hydralazine 2. BP stable at present  DVT prophylaxis: Heparin subQ Code Status: Full Family Communication: Pt in room, family not at bedsiee Disposition Plan: Uncertain at this time  Consultants:   Neurology  Procedures:     Antimicrobials: Anti-infectives (From admission, onward)   None      Subjective: Without complaints this AM  Objective: Vitals:   01/20/18 0605 01/20/18 0839 01/20/18 1437 01/20/18 1634  BP: 101/62 114/63 121/69 (!) 106/55  Pulse: (!) 56 64 64 60  Resp: 18  (!) 22 20  Temp: 98.3 F (36.8 C) 98.2 F (36.8 C) 100 F (37.8 C) 98.8  F (37.1 C)  TempSrc: Oral Oral  Oral  SpO2: 100% 99% 96% 99%  Weight:      Height:        Intake/Output Summary (Last 24 hours) at 01/20/2018 1651 Last data filed at 01/20/2018 1941 Gross per 24 hour  Intake -  Output 1400 ml  Net -1400 ml   Filed Weights   01/18/18 1809 01/19/18 1041  Weight: 94.8 kg 85.4 kg    Examination: General exam: Conversant, in no acute distress Respiratory system: normal chest rise, clear, no audible  wheezing Cardiovascular system: regular rhythm, s1-s2 Gastrointestinal system: Nondistended, nontender, pos BS Central nervous system: No seizures, no tremors Extremities: No cyanosis, no joint deformities, no LE edema Skin: No rashes, no pallor Psychiatry: Affect normal // no auditory hallucinations   Data Reviewed: I have personally reviewed following labs and imaging studies  CBC: Recent Labs  Lab 01/18/18 1715 01/19/18 0059 01/19/18 0634  WBC 7.3 6.5 5.4  NEUTROABS 4.6  --  2.5  HGB 13.3 11.8* 11.3*  HCT 40.2 36.1* 33.9*  MCV 91.8 91.6 90.9  PLT 273 245 740   Basic Metabolic Panel: Recent Labs  Lab 01/18/18 1818 01/19/18 0059 01/19/18 0634 01/20/18 0300  NA 138  --  138 133*  K 4.0  --  3.4* 3.6  CL 94*  --  97* 93*  CO2 24  --  26 28  GLUCOSE 166*  --  205* 418*  BUN 84*  --  78* 73*  CREATININE 9.20* 9.02* 8.68* 6.37*  CALCIUM 9.2  --  8.2* 8.3*  MG  --   --   --  2.0  PHOS  --  5.1*  --   --    GFR: Estimated Creatinine Clearance: 14 mL/min (A) (by C-G formula based on SCr of 6.37 mg/dL (H)). Liver Function Tests: Recent Labs  Lab 01/18/18 1818 01/19/18 0634  AST 22 15  ALT 12 12  ALKPHOS 75 58  BILITOT 0.7 0.5  PROT 8.2* 6.2*  ALBUMIN 4.1 3.3*   No results for input(s): LIPASE, AMYLASE in the last 168 hours. No results for input(s): AMMONIA in the last 168 hours. Coagulation Profile: No results for input(s): INR, PROTIME in the last 168 hours. Cardiac Enzymes: Recent Labs  Lab 01/19/18 0059 01/19/18 0634 01/19/18 1132  CKTOTAL 144  --   --   TROPONINI <0.03 0.03* <0.03   BNP (last 3 results) No results for input(s): PROBNP in the last 8760 hours. HbA1C: Recent Labs    01/19/18 0634  HGBA1C 10.7*   CBG: Recent Labs  Lab 01/19/18 1705 01/19/18 2049 01/20/18 0825 01/20/18 1245 01/20/18 1639  GLUCAP 127* 253* 326* 343* 301*   Lipid Profile: Recent Labs    01/19/18 0634  CHOL 63  HDL 17*  LDLCALC NEG 1  TRIG 234*   CHOLHDL 3.7   Thyroid Function Tests: Recent Labs    01/19/18 0059  TSH 0.103*   Anemia Panel: No results for input(s): VITAMINB12, FOLATE, FERRITIN, TIBC, IRON, RETICCTPCT in the last 72 hours. Sepsis Labs: No results for input(s): PROCALCITON, LATICACIDVEN in the last 168 hours.  No results found for this or any previous visit (from the past 240 hour(s)).   Radiology Studies: Ct Head Wo Contrast  Result Date: 01/18/2018 CLINICAL DATA:  Instability with multiple falls over the past 2 weeks. EXAM: CT HEAD WITHOUT CONTRAST TECHNIQUE: Contiguous axial images were obtained from the base of the skull through the vertex without intravenous contrast. COMPARISON:  04/15/2016 MRI of the brain FINDINGS: Brain: Remote pontine infarct on the right with encephalomalacia. Remote infarcts in the right MCA territory with encephalomalacia involving the right frontal and parietal lobes as well as anterior right basal ganglia. No hydrocephalus. Mild ex vacuo dilatation of the frontal horn of the right lateral ventricle from the described infarcts. No acute intracranial hemorrhage, midline shift or edema. No extra-axial fluid. Vascular: No hyperdense vessel sign. Atherosclerosis of the carotid siphons bilaterally. Skull: Intact Sinuses/Orbits: Intact Other: None IMPRESSION: Remote infarcts of the right pons and MCA distribution. No acute intracranial abnormality. Electronically Signed   By: Ashley Royalty M.D.   On: 01/18/2018 20:54   [name] Head Wo Contrast  Result Date: 01/19/2018 CLINICAL DATA:  Progressive imbalance with difficulty walking. Punctate acute/subacute infarcts in the inferior right thalamic region on MRI. EXAM: MRA NECK WITHOUT CONTRAST MRA HEAD WITHOUT CONTRAST TECHNIQUE: Multiplanar and multiecho pulse sequences of the neck were obtained without intravenous contrast. Angiographic images of the neck were obtained using MRA technique without intravenous contast. Angiographic images of the Circle  of Willis were obtained using MRA technique without intravenous contrast. COMPARISON:  Head MRA 04/15/2016.  Neck CTA 01/18/2016. FINDINGS: MRA HEAD FINDINGS The visualized distal vertebral arteries are patent to the basilar with the left being dominant. Patent left PICA, bilateral AICA, and bilateral SCA origins are identified. The basilar artery is widely patent. There is a patent left posterior communicating artery. A right posterior communicating artery is also present though demonstrates diminished signal particularly proximally and distally which may reflect underlying atherosclerotic narrowing. There are severe tandem right P2 stenoses which have progressed from the prior MRA. There is at most mild proximal left P2 stenosis. The internal carotid arteries are patent from skull base to carotid termini without stenosis on the left. There is moderate stenosis of the right supraclinoid ICA, less severe in appearance compared to the prior MRA. There is progressive narrowing of the right ICA terminus including a new severe ACA origin stenosis and a persistent severe MCA origin stenosis. ACAs and MCAs are patent without evidence of proximal branch occlusion. There is no significant left A1 or left M1 stenosis. A severe proximal left M2 inferior division stenosis is unchanged. No aneurysm is identified. MRA NECK FINDINGS Examination is mildly limited by motion artifact and noncontrast technique. The aortic arch and proximal common carotid arteries were not imaged. The included portion of the common carotid and cervical internal carotid arteries are patent without evidence of significant stenosis. Minimal narrowing of the distal right cervical ICA is similar to the prior neck CTA. The vertebral arteries are patent with antegrade flow bilaterally. The left vertebral artery is dominant without evidence of stenosis or dissection. No significant right vertebral artery stenosis is identified although the proximal V1 segment  is suboptimally evaluated. IMPRESSION: 1. No large vessel occlusion. 2. Progressive intracranial atherosclerosis with severe stenoses involving the right P2 segment and right ACA and right MCA origins. Moderate right ICA supraclinoid and terminus stenoses. 3. Patent carotid and vertebral arteries in the neck without significant stenosis identified within mild limitations as detailed above. Electronically Signed   By: Logan Bores M.D.   On: 01/19/2018 10:55   [name] Neck Wo Contrast  Result Date: 01/19/2018 CLINICAL DATA:  Progressive imbalance with difficulty walking. Punctate acute/subacute infarcts in the inferior right thalamic region on MRI. EXAM: MRA NECK WITHOUT CONTRAST MRA HEAD WITHOUT CONTRAST TECHNIQUE: Multiplanar and multiecho pulse sequences of the neck were obtained without intravenous contrast.  Angiographic images of the neck were obtained using MRA technique without intravenous contast. Angiographic images of the Circle of Willis were obtained using MRA technique without intravenous contrast. COMPARISON:  Head MRA 04/15/2016.  Neck CTA 01/18/2016. FINDINGS: MRA HEAD FINDINGS The visualized distal vertebral arteries are patent to the basilar with the left being dominant. Patent left PICA, bilateral AICA, and bilateral SCA origins are identified. The basilar artery is widely patent. There is a patent left posterior communicating artery. A right posterior communicating artery is also present though demonstrates diminished signal particularly proximally and distally which may reflect underlying atherosclerotic narrowing. There are severe tandem right P2 stenoses which have progressed from the prior MRA. There is at most mild proximal left P2 stenosis. The internal carotid arteries are patent from skull base to carotid termini without stenosis on the left. There is moderate stenosis of the right supraclinoid ICA, less severe in appearance compared to the prior MRA. There is progressive narrowing of  the right ICA terminus including a new severe ACA origin stenosis and a persistent severe MCA origin stenosis. ACAs and MCAs are patent without evidence of proximal branch occlusion. There is no significant left A1 or left M1 stenosis. A severe proximal left M2 inferior division stenosis is unchanged. No aneurysm is identified. MRA NECK FINDINGS Examination is mildly limited by motion artifact and noncontrast technique. The aortic arch and proximal common carotid arteries were not imaged. The included portion of the common carotid and cervical internal carotid arteries are patent without evidence of significant stenosis. Minimal narrowing of the distal right cervical ICA is similar to the prior neck CTA. The vertebral arteries are patent with antegrade flow bilaterally. The left vertebral artery is dominant without evidence of stenosis or dissection. No significant right vertebral artery stenosis is identified although the proximal V1 segment is suboptimally evaluated. IMPRESSION: 1. No large vessel occlusion. 2. Progressive intracranial atherosclerosis with severe stenoses involving the right P2 segment and right ACA and right MCA origins. Moderate right ICA supraclinoid and terminus stenoses. 3. Patent carotid and vertebral arteries in the neck without significant stenosis identified within mild limitations as detailed above. Electronically Signed   By: Logan Bores M.D.   On: 01/19/2018 10:55   Mr Brain Wo Contrast  Result Date: 01/19/2018 CLINICAL DATA:  57 y/o M; unwitnessed fall and ataxia. Difficulty walking for the past few weeks and increased falls. Ataxia, stroke suspected. EXAM: MRI HEAD WITHOUT CONTRAST TECHNIQUE: Multiplanar, multiecho pulse sequences of the brain and surrounding structures were obtained without intravenous contrast. COMPARISON:  01/18/2018 CT head.  04/15/2016 MRI head. FINDINGS: Brain: 3 punctate foci of reduced diffusion are present within the right inferior thalamus and right  posterior hippocampus compatible with acute/early subacute infarction. No associated hemorrhage or mass effect. Stable large chronic infarct in the right MCA distribution, right anterior basal ganglia chronic lacunar infarct, and small chronic infarct in the right hemi pons with hemosiderin deposition. No extra-axial collection, hydrocephalus, focal mass effect, or herniation. Ex vacuo dilatation of the right lateral ventricle. Vascular: Normal flow voids. Skull and upper cervical spine: Normal marrow signal. Sinuses/Orbits: Mild diffuse paranasal sinus mucosal thickening. Partial opacification of the left mastoid air cells. Left intra-ocular lens replacement. Other: None. IMPRESSION: 1. 3 punctate foci of acute/early subacute infarction in the right inferior thalamus and right posterior hippocampus. No associated hemorrhage or mass effect. 2. Stable chronic infarcts in the right MCA distribution, right anterior basal ganglia, and right hemi pons. 3. Mild paranasal sinus disease. These results  will be called to the ordering clinician or representative by the Radiologist Assistant, and communication documented in the PACS or zVision Dashboard. Electronically Signed   By: Kristine Garbe M.D.   On: 01/19/2018 02:49   US Abdomen Complete  Result Date: 01/19/2018 CLINICAL DATA:  Acute renal failure with nausea and vomiting EXAM: ABDOMEN ULTRASOUND COMPLETE COMPARISON:  None. FINDINGS: Gallbladder: No gallstones or wall thickening visualized. No sonographic Murphy sign noted by sonographer. Common bile duct: Diameter: 3 mm Liver: No focal lesion identified. Within normal limits in parenchymal echogenicity. Portal vein is patent on color Doppler imaging with normal direction of blood flow towards the liver. IVC: No abnormality visualized. Pancreas: Visualized portion unremarkable. Spleen: Size and appearance within normal limits. Right Kidney: Length: 11.5 cm. Echogenicity within normal limits. No mass or  hydronephrosis visualized. Left Kidney: Length: 10 cm. Echogenicity within normal limits. No mass or hydronephrosis visualized. Abdominal aorta: No aneurysm visualized. IMPRESSION: Negative abdominal ultrasound. Electronically Signed   By: Monte Fantasia M.D.   On: 01/19/2018 12:49    Scheduled Meds: . aspirin EC  81 mg Oral Daily  . brimonidine  1 drop Both Eyes TID  . brinzolamide  1 drop Both Eyes TID  . heparin  5,000 Units Subcutaneous Q8H  . insulin aspart  0-9 Units Subcutaneous TID WC  . insulin glargine  10 Units Subcutaneous Daily  . ticagrelor  90 mg Oral BID  . timolol  1 drop Both Eyes Daily   Continuous Infusions:    LOS: 2 days   Marylu Lund, MD [facility] Pager On Amion  If 7PM-7AM, please contact night-coverage 01/20/2018, 4:51 PM

## 2018-01-20 NOTE — Progress Notes (Signed)
Physical Therapy Treatment Patient Details Name: Cory Carpenter MRN: [id number] DOB: 07-03-1960 Today's Date: 01/20/2018    History of Present Illness Pt adm with nausea, vomiting, diarrhea and acute renal failure. Pt with recent onset of frequent falls and MRI revealed 3 small punctate acute/early subacute strokes in the right thalamus. PMH - Rt CVA 12/2015 and 03/2016, DM, glaucoma, narcolepsy    PT Comments    Patient progressing slowly towards PT goals. Tolerated gait training and stair training with Min guard-Min A for balance/safety. Pt with poor safety awareness and poor awareness of deficits. Staggering during gait esp with head turns. Impulsive at times requiring Min A for support. Recommend use of RW instead of cane for balance and to help decrease fall risk. Recommend neuro OPPT as well as 24/7 supervision for OOB mobility at home from spouse. Will follow.    Follow Up Recommendations  Outpatient PT;Supervision/Assistance - 24 hour     Equipment Recommendations  None recommended by PT    Recommendations for Other Services       Precautions / Restrictions Precautions Precautions: Fall Restrictions Weight Bearing Restrictions: No    Mobility  Bed Mobility Overal bed mobility: Needs Assistance Bed Mobility: Supine to Sit     Supine to sit: Supervision;HOB elevated     General bed mobility comments: Supervision for safety due to impulsivity.  Transfers Overall transfer level: Needs assistance Equipment used: Straight cane Transfers: Sit to/from Stand Sit to Stand: Min guard         General transfer comment: Assist for balance and safety  Ambulation/Gait Ambulation/Gait assistance: Min guard;Min assist Gait Distance (Feet): 250 Feet Assistive device: Straight cane Gait Pattern/deviations: Step-through pattern;Decreased stride length;Staggering right;Staggering left Gait velocity: decr   General Gait Details: Slow, unsteady gait staggering to right/left  with head turns and varying gait speeds. Min A at times for balance.    Stairs Stairs: Yes Stairs assistance: Min guard Stair Management: Step to pattern;Alternating pattern;One rail Right;With cane Number of Stairs: 4 General stair comments: Cues for safety as pt impulsive and unsafe at time descending with right left with left knee almost buckling despite cues for correct technique.   Wheelchair Mobility    Modified Rankin (Stroke Patients Only) Modified Rankin (Stroke Patients Only) Pre-Morbid Rankin Score: Slight disability Modified Rankin: Moderately severe disability     Balance Overall balance assessment: Needs assistance Sitting-balance support: No upper extremity supported;Feet supported Sitting balance-Leahy Scale: Good     Standing balance support: No upper extremity supported;During functional activity Standing balance-Leahy Scale: Fair Standing balance comment: can stand statically at sink without support or LOB                            Cognition Arousal/Alertness: Awake/alert Behavior During Therapy: Impulsive Overall Cognitive Status: No family/caregiver present to determine baseline cognitive functioning Area of Impairment: Memory;Safety/judgement;Problem solving                     Memory: Decreased short-term memory   Safety/Judgement: Decreased awareness of safety;Decreased awareness of deficits   Problem Solving: Requires verbal cues;Requires tactile cues General Comments: Pt with poor safety awareness and poor awareness of deficits. "I am fine" despite left knee instability during session.      Exercises      General Comments General comments (skin integrity, edema, etc.): Pt left in room with tech to take bath.      Pertinent Vitals/Pain Pain Assessment: No/denies pain  Home Living                      Prior Function            PT Goals (current goals can now be found in the care plan section)  Progress towards PT goals: Progressing toward goals    Frequency    Min 4X/week      PT Plan Current plan remains appropriate    Co-evaluation              AM-PAC PT "6 Clicks" Mobility   Outcome Measure  Help needed turning from your back to your side while in a flat bed without using bedrails?: None Help needed moving from lying on your back to sitting on the side of a flat bed without using bedrails?: A Little Help needed moving to and from a bed to a chair (including a wheelchair)?: None Help needed standing up from a chair using your arms (e.g., wheelchair or bedside chair)?: None Help needed to walk in hospital room?: A Little Help needed climbing 3-5 steps with a railing? : A Little 6 Click Score: 21    End of Session Equipment Utilized During Treatment: Gait belt Activity Tolerance: Patient tolerated treatment well Patient left: in chair;with call bell/phone within reach;with nursing/sitter in room Nurse Communication: Mobility status PT Visit Diagnosis: Unsteadiness on feet (R26.81);Other abnormalities of gait and mobility (R26.89)     Time: 1020-1035 PT Time Calculation (min) (ACUTE ONLY): 15 min  Charges:  $Gait Training: 8-22 mins                     Wray Kearns, PT, DPT Acute Rehabilitation Services Pager [phone] Office [phone]       [place] 01/20/2018, 12:59 PM

## 2018-01-20 NOTE — Progress Notes (Signed)
STROKE TEAM PROGRESS NOTE   SUBJECTIVE (INTERVAL HISTORY) No family is at the bedside.  Pt is lying in bed calling meal service ordering dinner over the phone. He voiced no complains. He said he has not had good appetite but I encouraged him for po intake.    OBJECTIVE Vitals:   01/19/18 1041 01/19/18 2134 01/19/18 2327 01/20/18 0605  BP: (!) 92/48 (!) 94/43  101/62  Pulse: 64 (!) 56  (!) 56  Resp: 14 17 18 18   Temp: 98.7 F (37.1 C) 98.3 F (36.8 C)  98.3 F (36.8 C)  TempSrc: Axillary Oral  Oral  SpO2: 93% 94% 95% 100%  Weight: 85.4 kg     Height: 5\' 9"  (1.753 m)       CBC:  Recent Labs  Lab 01/18/18 1715 01/19/18 0059 01/19/18 0634  WBC 7.3 6.5 5.4  NEUTROABS 4.6  --  2.5  HGB 13.3 11.8* 11.3*  HCT 40.2 36.1* 33.9*  MCV 91.8 91.6 90.9  PLT 273 245 245    Basic Metabolic Panel:  Recent Labs  Lab 01/19/18 0059 01/19/18 0634 01/20/18 0300  NA  --  138 133*  K  --  3.4* 3.6  CL  --  97* 93*  CO2  --  26 28  GLUCOSE  --  205* 418*  BUN  --  78* 73*  CREATININE 9.02* 8.68* 6.37*  CALCIUM  --  8.2* 8.3*  MG  --   --  2.0  PHOS 5.1*  --   --     Lipid Panel:     Component Value Date/Time   CHOL 63 01/19/2018 0634   TRIG 234 (H) 01/19/2018 0634   HDL 17 (L) 01/19/2018 0634   CHOLHDL 3.7 01/19/2018 0634   VLDL 47 (H) 01/19/2018 0634   LDLCALC NEG 1 01/19/2018 0634   HgbA1c:  Lab Results  Component Value Date   HGBA1C 10.7 (H) 01/19/2018   Urine Drug Screen:     Component Value Date/Time   LABOPIA NONE DETECTED 04/15/2016 1559   COCAINSCRNUR NONE DETECTED 04/15/2016 1559   LABBENZ NONE DETECTED 04/15/2016 1559   AMPHETMU NONE DETECTED 04/15/2016 1559   THCU NONE DETECTED 04/15/2016 1559   LABBARB NONE DETECTED 04/15/2016 1559    Alcohol Level     Component Value Date/Time   ETH <10 01/18/2018 1818    IMAGING  Ct Head Wo Contrast 01/18/2018 IMPRESSION:  Remote infarcts of the right pons and MCA distribution. No acute intracranial  abnormality.    Mr Cory Carpenter Head Wo Contrast Mr Cory Carpenter Neck Wo Contrast 01/19/2018 IMPRESSION:  1. No large vessel occlusion.  2. Progressive intracranial atherosclerosis with severe stenoses involving the right P2 segment and right ACA and right MCA origins. Moderate right ICA supraclinoid and terminus stenoses.  3. Patent carotid and vertebral arteries in the neck without significant stenosis identified within mild limitations as detailed above.    Mr Brain Wo Contrast 01/19/2018 IMPRESSION:  1. 3 punctate foci of acute/early subacute infarction in the right inferior thalamus and right posterior hippocampus. No associated hemorrhage or mass effect.  2. Stable chronic infarcts in the right MCA distribution, right anterior basal ganglia, and right hemi pons.  3. Mild paranasal sinus disease.    US Abdomen Complete 01/19/2018 IMPRESSION:  Negative abdominal ultrasound.   Transthoracic Echocardiogram - Normal LV systolic function; mild diastoilc dysfunction; trace   AI, MR and TR.    PHYSICAL EXAM  Temp:  [98.3 F (36.8 C)-98.7 F (37.1  C)] 98.3 F (36.8 C) (11/23 0605) Pulse Rate:  [56-64] 56 (11/23 0605) Resp:  [14-18] 18 (11/23 0605) BP: (92-101)/(43-62) 101/62 (11/23 0605) SpO2:  [93 %-100 %] 100 % (11/23 0605) Weight:  [85.4 kg] 85.4 kg (11/22 1041)  General - Well nourished, well developed, not in acute distress  Ophthalmologic - fundi not visualized due to noncooperation.  Cardiovascular - Regular rate and rhythm.  Mental Status -  Level of arousal and orientation to time, place, and person were intact. Language including expression, naming, repetition, comprehension was assessed and found intact. Fund of Knowledge was assessed and was intact.  Cranial Nerves II - XII - II - Visual field intact OD, left eye visual acuity only has light perception. III, IV, VI - Extraocular movements intact. V - Facial sensation intact bilaterally. VII - Facial movement intact  bilaterally. VIII - Hearing & vestibular intact bilaterally. X - Palate elevates symmetrically. XI - Chin turning & shoulder shrug intact bilaterally. XII - Tongue protrusion intact.  Motor Strength - The patient's strength was normal in all extremities and pronator drift was absent.  Bulk was normal and fasciculations were absent.   Motor Tone - Muscle tone was assessed at the neck and appendages and was normal.  Reflexes - The patient's reflexes were symmetrical in all extremities and he had no pathological reflexes.  Sensory - Light touch, temperature/pinprick were assessed and were symmetrical.    Coordination - The patient had normal movements in the hands and feet with no ataxia or dysmetria.  Tremor was absent.  Gait and Station - deferred.   ASSESSMENT/PLAN [name] is a 57 y.o. male with history of diabetes, hypertension, sleep apnea, stroke in the right MCA territory as well as an acute stroke in the right pons in 2018 presenting with multiple falls. He did not receive IV t-PA due to late presentation.  Stroke: Right inferior thalamus and right posterior hippocampus 3 punctate infarcts infarcts -likely due to small vessel disease in the setting of severe renal insufficiency and dehydration.  CT head - Remote infarcts of the right pons and MCA distribution.   MRI head - 3 punctate foci of acute/early subacute infarction in the right inferior thalamus and right posterior hippocampus. Stable chronic infarcts.  MRA head and neck- No large vessel occlusion. Progressive intracranial atherosclerosis with severe stenoses involving the right P2 segment and right ACA and right MCA origins. Moderate right ICA supraclinoid and terminus stenoses.  2D Echo - EF 55 to 60%  LDL - Neg 1  HgbA1c - 10.7  VTE prophylaxis - [place] Heparin  Diet - renal carb modified  aspirin 81 mg daily and Brilinta 90 mg twice daily prior to admission, now on aspirin 81 mg daily and Brilinta 90 mg  twice daily which are what his [place] doctor prescribed for him.  Patient counseled to be compliant with his antithrombotic medications  Ongoing aggressive stroke risk factor management  Therapy recommendations:  Outpt PT and OT  Disposition:  Pending  Severe AKI  Baseline creatinine 1.1, however, 9.2 on admission  Likely multifactorial, including nausea vomiting with diarrhea, severe dehydration, taking HCTZ, lisinopril and metformin at home.  On aggressive IV fluid infusion  Repeat creatinine 8.68->6.37  Off IV fluid, encourage p.o. intake  History of stroke  12/2015 admitted for left facial droop with slurred speech and altered mental status as well as left arm weakness.  MRI showed right MCA cortical and subcortical acute infarct but also old right MCA infarcts.Marland Kitchen  MRA head severe right M1 right siphon and left M2 stenosis.  CTA neck showed left ICA soft plaque.  EF 60 to 65%.  LDL 27 A1c 14.2.  He was put on DAPT for 3 months.  In 03/2016 he was admitted again for imbalance and slurred speech.  MRI showed right pontine infarct.  MRA showed severe right siphon and right MCA stenosis.  Carotid Doppler negative.  EF 60 to 65%.  LDL 15 and A1c 7.9.  He was put on DAPT and Lipitor 80 and recommended 30-day cardio event monitoring as outpatient.  He has been following with his [facility] in [place], for whatever reason, his DAPT was changed from aspirin and the Plavix to aspirin and Brilinta.  Hypertension  Stable but occassionally low . Permissive hypertension (OK if < 220/120) but gradually normalize in 5-7 days . Long-term BP goal normotensive  Hyperlipidemia  Lipid lowering medication PTA:  Lipitor 80 mg daily  LDL neg 1, goal < 70  Hold off lipitor for now given extremely low LDL  Diabetes  HgbA1c 10.7, goal < 7.0  Uncontrolled  SSI  CBG monitoring  Close PCP follow-up for better DM control  Other Stroke Risk Factors  Former cigarette smoker - quit  ETOH  use, advised to drink no more than 1 alcoholic beverage per day.  Family hx stroke (mother)  Obstructive sleep apnea  Other Active Problems  Mild hypokalemia  Hospital day # 2  Neurology will sign off. Please call with questions. Pt will follow up with his [facility] neurology and PCP in about 4 weeks. Thanks for the consult.  Rosalin Hawking, MD PhD Stroke Neurology 01/20/2018 5:17 PM    To contact Stroke Continuity provider, please refer to [email or web address] After hours, contact General Neurology

## 2018-01-21 LAB — BASIC METABOLIC PANEL
ANION GAP: 10 (ref 5–15)
BUN: 57 mg/dL — AB (ref 6–20)
CHLORIDE: 97 mmol/L — AB (ref 98–111)
CO2: 28 mmol/L (ref 22–32)
Calcium: 8.3 mg/dL — ABNORMAL LOW (ref 8.9–10.3)
Creatinine, Ser: 3.62 mg/dL — ABNORMAL HIGH (ref 0.61–1.24)
GFR calc Af Amer: 20 mL/min — ABNORMAL LOW (ref >60)
GFR, EST NON AFRICAN AMERICAN: 17 mL/min — AB (ref >60)
Glucose, Bld: 284 mg/dL — ABNORMAL HIGH (ref 70–99)
POTASSIUM: 3.5 mmol/L (ref 3.5–5.1)
SODIUM: 135 mmol/L (ref 135–145)

## 2018-01-21 LAB — GLUCOSE, CAPILLARY
GLUCOSE-CAPILLARY: 255 mg/dL — AB (ref 70–99)
GLUCOSE-CAPILLARY: 327 mg/dL — AB (ref 70–99)
Glucose-Capillary: 286 mg/dL — ABNORMAL HIGH (ref 70–99)
Glucose-Capillary: 273 mg/dL — ABNORMAL HIGH (ref 70–99)

## 2018-01-21 MED ORDER — INSULIN ASPART 100 UNIT/ML ~~LOC~~ SOLN
8.0000 [IU] | Freq: Three times a day (TID) | SUBCUTANEOUS | Status: DC
  Administered 2018-01-21: 8 [IU] via SUBCUTANEOUS

## 2018-01-21 MED ORDER — INSULIN ASPART 100 UNIT/ML ~~LOC~~ SOLN
0.0000 [IU] | Freq: Every day | SUBCUTANEOUS | Status: DC
  Administered 2018-01-21: 3 [IU] via SUBCUTANEOUS

## 2018-01-21 MED ORDER — INSULIN GLARGINE 100 UNIT/ML ~~LOC~~ SOLN: 20.0000 [IU] | Freq: Every day | SUBCUTANEOUS | Status: DC

## 2018-01-21 MED ORDER — INSULIN ASPART 100 UNIT/ML ~~LOC~~ SOLN: 5.0000 [IU] | Freq: Three times a day (TID) | SUBCUTANEOUS | Status: DC

## 2018-01-21 MED ORDER — INSULIN GLARGINE 100 UNIT/ML ~~LOC~~ SOLN
15.0000 [IU] | Freq: Once | SUBCUTANEOUS | Status: AC
  Administered 2018-01-21: 15 [IU] via SUBCUTANEOUS
  Filled 2018-01-21: qty 0.15

## 2018-01-21 MED ORDER — INSULIN ASPART 100 UNIT/ML ~~LOC~~ SOLN
0.0000 [IU] | Freq: Three times a day (TID) | SUBCUTANEOUS | Status: DC
  Administered 2018-01-21: 8 [IU] via SUBCUTANEOUS
  Administered 2018-01-22: 3 [IU] via SUBCUTANEOUS
  Administered 2018-01-22: 5 [IU] via SUBCUTANEOUS

## 2018-01-21 MED ORDER — INSULIN GLARGINE 100 UNIT/ML ~~LOC~~ SOLN
25.0000 [IU] | Freq: Every day | SUBCUTANEOUS | Status: DC
  Filled 2018-01-21: qty 0.25

## 2018-01-21 NOTE — Care Management Note (Signed)
Case Management Note  Patient Details  Name: Adom Schoeneck MRN: [id number] Date of Birth: 04-29-1960  Subjective/Objective:                    Action/Plan:  Spoke w patient at bedside. He did not participate much in conversation but was agreeable to OP PT OT. Referral placed to [facility]. No other CM needs identified at this time.  Expected Discharge Date:                  Expected Discharge Plan:     In-House Referral:     Discharge planning Services  CM Consult  Post Acute Care Choice:    Choice offered to:     DME Arranged:    DME Agency:     HH Arranged:    HH Agency:     Status of Service:     If discussed at [employer], dates discussed:    Additional Comments:  Carles Collet, RN 01/21/2018, 11:55 AM

## 2018-01-21 NOTE — Progress Notes (Signed)
PROGRESS NOTE    Cory Carpenter  HBZ:[id number] DOB: [birth year] DOA: 01/18/2018 PCP: [facility]    Brief Narrative:  57 y.o. male with history of stroke in February 2018, diabetes mellitus type 2, hypertension, chronic disease, sleep apnea and narcolepsy on CPAP has been feeling increasingly difficult to walk with balance issues over the last 3 weeks.  Patient states he has been having some gait difficulties since his last stroke in February 2018 but over the last 3 weeks it is acutely worsened.  He has been taking care of his wife who has been treated for flu and bronchitis for the last few days.  Patient states over the last week and half has been having nausea vomiting and diarrhea vomiting is at least 4-5 episodes daily and diarrhea at least 2 episodes daily.  Denies any blood in the diarrhea or vomitus.  Has been having some hiccups.  Also has had at least 6 falls in the last 2 weeks.  Denies losing consciousness but has had times at his head.  ED Course: In the ER patient appears generally weak.  Difficult to walk.  Labs show acute renal failure with creatinine of 9 with urine showing mild proteinuria otherwise no casts.  Patient states he is making urine.  CT of the head was unremarkable.  On my exam patient able to move all extremities but appears a little bit ataxic.  Patient was given Ringer lactate 1 L fluid bolus and admitted for acute renal failure and further work-up of his difficulty with walking.  Assessment & Plan:   Principal Problem:   Acute renal failure (ARF) (HCC) Active Problems:   Essential hypertension   DM (diabetes mellitus), secondary, uncontrolled, w/neurologic complic (HCC)   Ataxia   Nausea vomiting and diarrhea   Cerebral thrombosis with cerebral infarction  1. Acute renal failure 1. Cr of over 9 at presentation 2. Patient presented with n/v and poor po intake in setting of HCTZ and Lisinopril 3. Considering pre-renal azotemia vs ATN 4. Voiding  well, no evidence of uremia 5. Improving with IVF hydration. Will reduce IVF to 75cc/hr 6. Continue indwelling cath for strict I/O 7. Cr has improved to 3.6 8. Baseline Cr 1.1-1.2 9. Repeat bmet in AM 2. Ataxia and difficulty with gait with acute CVA 1. Patient underwent MRI brain with findings of acute CVA 2. Seen by Neurology. Discussed with Dr. Erlinda Hong. Plan to continue ASA and brilinta 3. Stable at present 3. Diabetes mellitus type 2 1. Long-acting insulin was initially held secondary to worsening renal function 2. Continued SSI coverage, increase to moderate dosing 3. Glucose in the 300's. Increase lantus to 20 units and will add 5 units Aspart pre-meal coverage 4. Titrate insulin as tolerated 4. Nausea vomiting and diarrhea  1. Continue supportive care for now with IVF 2. No further episodes this admission 3. Improved with IVF hydration 5. History of sleep apnea and narcolepsy on CPAP. 1. Declined CPAP last night 6. Hypertension 1. Continue on PRN hydralazine 2. BP remains stable and controlled  DVT prophylaxis: Heparin subQ Code Status: Full Family Communication: Pt in room, family not at bedsiee Disposition Plan: Uncertain at this time  Consultants:   Neurology  Procedures:     Antimicrobials: Anti-infectives (From admission, onward)   None      Subjective: No complaints this AM  Objective: Vitals:   01/20/18 2328 01/21/18 0400 01/21/18 0446 01/21/18 0822  BP: (!) 123/59 (!) 112/53  116/61  Pulse: 63 60  63  Resp: 16 18    Temp: 99.8 F (37.7 C) 98.8 F (37.1 C)    TempSrc: Oral Oral    SpO2: 96% 94%  91%  Weight:   88.2 kg   Height:        Intake/Output Summary (Last 24 hours) at 01/21/2018 1222 Last data filed at 01/21/2018 1113 Gross per 24 hour  Intake 1870.5 ml  Output 2200 ml  Net -329.5 ml   Filed Weights   01/18/18 1809 01/19/18 1041 01/21/18 0446  Weight: 94.8 kg 85.4 kg 88.2 kg    Examination: General exam: Awake, laying in bed,  in nad Respiratory system: Normal respiratory effort, no wheezing Cardiovascular system: regular rate, s1, s2 Gastrointestinal system: Soft, nondistended, positive BS Central nervous system: CN2-12 grossly intact, strength intact Extremities: Perfused, no clubbing Skin: Normal skin turgor, no notable skin lesions seen Psychiatry: Mood normal // no visual hallucinations   Data Reviewed: I have personally reviewed following labs and imaging studies  CBC: Recent Labs  Lab 01/18/18 1715 01/19/18 0059 01/19/18 0634  WBC 7.3 6.5 5.4  NEUTROABS 4.6  --  2.5  HGB 13.3 11.8* 11.3*  HCT 40.2 36.1* 33.9*  MCV 91.8 91.6 90.9  PLT 273 245 998   Basic Metabolic Panel: Recent Labs  Lab 01/18/18 1818 01/19/18 0059 01/19/18 0634 01/20/18 0300 01/21/18 0420  NA 138  --  138 133* 135  K 4.0  --  3.4* 3.6 3.5  CL 94*  --  97* 93* 97*  CO2 24  --  26 28 28   GLUCOSE 166*  --  205* 418* 284*  BUN 84*  --  78* 73* 57*  CREATININE 9.20* 9.02* 8.68* 6.37* 3.62*  CALCIUM 9.2  --  8.2* 8.3* 8.3*  MG  --   --   --  2.0  --   PHOS  --  5.1*  --   --   --    GFR: Estimated Creatinine Clearance: 25 mL/min (A) (by C-G formula based on SCr of 3.62 mg/dL (H)). Liver Function Tests: Recent Labs  Lab 01/18/18 1818 01/19/18 0634  AST 22 15  ALT 12 12  ALKPHOS 75 58  BILITOT 0.7 0.5  PROT 8.2* 6.2*  ALBUMIN 4.1 3.3*   No results for input(s): LIPASE, AMYLASE in the last 168 hours. No results for input(s): AMMONIA in the last 168 hours. Coagulation Profile: No results for input(s): INR, PROTIME in the last 168 hours. Cardiac Enzymes: Recent Labs  Lab 01/19/18 0059 01/19/18 0634 01/19/18 1132  CKTOTAL 144  --   --   TROPONINI <0.03 0.03* <0.03   BNP (last 3 results) No results for input(s): PROBNP in the last 8760 hours. HbA1C: Recent Labs    01/19/18 0634  HGBA1C 10.7*   CBG: Recent Labs  Lab 01/20/18 1245 01/20/18 1639 01/20/18 2243 01/21/18 0800 01/21/18 1211  GLUCAP  343* 301* 268* 255* 327*   Lipid Profile: Recent Labs    01/19/18 0634  CHOL 63  HDL 17*  LDLCALC NEG 1  TRIG 234*  CHOLHDL 3.7   Thyroid Function Tests: Recent Labs    01/19/18 0059  TSH 0.103*   Anemia Panel: No results for input(s): VITAMINB12, FOLATE, FERRITIN, TIBC, IRON, RETICCTPCT in the last 72 hours. Sepsis Labs: No results for input(s): PROCALCITON, LATICACIDVEN in the last 168 hours.  No results found for this or any previous visit (from the past 240 hour(s)).   Radiology Studies: US Abdomen Complete  Result Date: 01/19/2018 CLINICAL  DATA:  Acute renal failure with nausea and vomiting EXAM: ABDOMEN ULTRASOUND COMPLETE COMPARISON:  None. FINDINGS: Gallbladder: No gallstones or wall thickening visualized. No sonographic Murphy sign noted by sonographer. Common bile duct: Diameter: 3 mm Liver: No focal lesion identified. Within normal limits in parenchymal echogenicity. Portal vein is patent on color Doppler imaging with normal direction of blood flow towards the liver. IVC: No abnormality visualized. Pancreas: Visualized portion unremarkable. Spleen: Size and appearance within normal limits. Right Kidney: Length: 11.5 cm. Echogenicity within normal limits. No mass or hydronephrosis visualized. Left Kidney: Length: 10 cm. Echogenicity within normal limits. No mass or hydronephrosis visualized. Abdominal aorta: No aneurysm visualized. IMPRESSION: Negative abdominal ultrasound. Electronically Signed   By: Monte Fantasia M.D.   On: 01/19/2018 12:49    Scheduled Meds: . aspirin EC  81 mg Oral Daily  . brimonidine  1 drop Both Eyes TID  . brinzolamide  1 drop Both Eyes TID  . heparin  5,000 Units Subcutaneous Q8H  . insulin aspart  0-15 Units Subcutaneous TID WC  . insulin aspart  0-5 Units Subcutaneous QHS  . insulin aspart  5 Units Subcutaneous TID WC  . [START ON 01/22/2018] insulin glargine  20 Units Subcutaneous Daily  . ticagrelor  90 mg Oral BID  . timolol  1 drop  Both Eyes Daily   Continuous Infusions: . lactated ringers 100 mL/hr at 01/21/18 0409     LOS: 3 days   Marylu Lund, MD [facility] Pager On Amion  If 7PM-7AM, please contact night-coverage 01/21/2018, 12:22 PM

## 2018-01-21 NOTE — Progress Notes (Signed)
Patient refused CPAP tonight. There Is a machine in the room at this time. RN aware. Explained to Patient that if they changed their mind, to just have the RN call Respiratory and we would come set them up. 

## 2018-01-22 ENCOUNTER — Encounter (HOSPITAL_COMMUNITY): Payer: Self-pay | Admitting: General Practice

## 2018-01-22 DIAGNOSIS — N178 Other acute kidney failure: Secondary | ICD-10-CM

## 2018-01-22 LAB — GLUCOSE, CAPILLARY
GLUCOSE-CAPILLARY: 188 mg/dL — AB (ref 70–99)
Glucose-Capillary: 220 mg/dL — ABNORMAL HIGH (ref 70–99)

## 2018-01-22 LAB — PROTEIN ELECTROPHORESIS, SERUM
A/G Ratio: 1.1 (ref 0.7–1.7)
ALPHA-2-GLOBULIN: 1 g/dL (ref 0.4–1.0)
Albumin ELP: 3.5 g/dL (ref 2.9–4.4)
Alpha-1-Globulin: 0.2 g/dL (ref 0.0–0.4)
Beta Globulin: 1.1 g/dL (ref 0.7–1.3)
GAMMA GLOBULIN: 0.7 g/dL (ref 0.4–1.8)
Globulin, Total: 3.1 g/dL (ref 2.2–3.9)
TOTAL PROTEIN ELP: 6.6 g/dL (ref 6.0–8.5)

## 2018-01-22 LAB — BASIC METABOLIC PANEL
Anion gap: 9 (ref 5–15)
BUN: 39 mg/dL — ABNORMAL HIGH (ref 6–20)
CHLORIDE: 98 mmol/L (ref 98–111)
CO2: 30 mmol/L (ref 22–32)
CREATININE: 2.46 mg/dL — AB (ref 0.61–1.24)
Calcium: 8.7 mg/dL — ABNORMAL LOW (ref 8.9–10.3)
GFR calc Af Amer: 32 mL/min — ABNORMAL LOW (ref >60)
GFR calc non Af Amer: 28 mL/min — ABNORMAL LOW (ref >60)
GLUCOSE: 247 mg/dL — AB (ref 70–99)
POTASSIUM: 3.6 mmol/L (ref 3.5–5.1)
Sodium: 137 mmol/L (ref 135–145)

## 2018-01-22 MED ORDER — INSULIN PEN NEEDLE 31G X 5 MM MISC: 1.0000 | Freq: Three times a day (TID) | 0 refills | Status: AC

## 2018-01-22 MED ORDER — TICAGRELOR 90 MG PO TABS: 90.0000 mg | ORAL_TABLET | Freq: Two times a day (BID) | ORAL | 0 refills | Status: AC

## 2018-01-22 MED ORDER — INSULIN ASPART 100 UNIT/ML ~~LOC~~ SOLN
10.0000 [IU] | Freq: Three times a day (TID) | SUBCUTANEOUS | Status: DC
  Administered 2018-01-22 (×2): 10 [IU] via SUBCUTANEOUS

## 2018-01-22 MED ORDER — INSULIN GLARGINE 100 UNIT/ML ~~LOC~~ SOLN
30.0000 [IU] | Freq: Every day | SUBCUTANEOUS | Status: DC
  Administered 2018-01-22: 30 [IU] via SUBCUTANEOUS
  Filled 2018-01-22: qty 0.3

## 2018-01-22 MED ORDER — "INSULIN SYRINGE/NEEDLE 28G X 1/2"" 0.5 ML MISC": 1.0000 | Freq: Three times a day (TID) | 0 refills | Status: DC

## 2018-01-22 MED ORDER — BENZONATATE 100 MG PO CAPS: 100.0000 mg | ORAL_CAPSULE | Freq: Three times a day (TID) | ORAL | 0 refills | Status: DC | PRN

## 2018-01-22 NOTE — Discharge Summary (Signed)
Physician Discharge Summary  Cory Carpenter VOJ:[id number] DOB: 07/04/1960 DOA: 01/18/2018  PCP: [facility]  Admit date: 01/18/2018 Discharge date: 01/22/2018  Admitted From: Home Disposition:  Home  Recommendations for Outpatient Follow-up:  1. Follow up with PCP in 1-2 weeks 2. Follow up with Neurology as scheduled  Discharge Condition:Improved CODE STATUS:Full Diet recommendation: Diabetic   Brief/Interim Summary: 57 y.o.malewithhistory of stroke in February 2018, diabetes mellitus type 2, hypertension, chronic disease, sleep apnea and narcolepsy on CPAP has been feeling increasingly difficult to walk with balance issues over the last 3 weeks. Patient states he has been having some gait difficulties since his last stroke in February 2018 but over the last 3 weeks it is acutely worsened. He has been taking care of his wife who has been treated for flu and bronchitis for the last few days. Patient states over the last week and half has been having nausea vomiting and diarrhea vomiting is at least 4-5 episodes daily and diarrhea at least 2 episodes daily. Denies any blood in the diarrhea or vomitus. Has been having some hiccups. Also has had at least 6 falls in the last 2 weeks. Denies losing consciousness but has had times at his head.  ED Course:In the ER patient appears generally weak. Difficult to walk. Labs show acute renal failure with creatinine of 9 with urine showing mild proteinuria otherwise no casts. Patient states he is making urine. CT of the head was unremarkable. On my exam patient able to move all extremities but appears a little bit ataxic. Patient was given Ringer lactate 1 L fluid bolus and admitted for acute renal failure and further work-up of his difficulty with walking.    Discharge Diagnoses:  Principal Problem:   Acute renal failure (ARF) (HCC) Active Problems:   Essential hypertension   DM (diabetes mellitus), secondary,  uncontrolled, w/neurologic complic (HCC)   Ataxia   Nausea vomiting and diarrhea   Cerebral thrombosis with cerebral infarction   1. Acute renal failure 1. Cr of over 9 at presentation 2. Patient presented with n/v and poor po intake in setting of HCTZ and Lisinopril 3. Considering pre-renal azotemia vs ATN 4. Patient continued to void well without evidence of uremia 5. Improved with IVF hydration 6. Cr improved to 2.46 with continued steady improvement 7. Baseline Cr 1.1-1.2 8. Recommend repeat BMET within one week to be followed up by PCP. If renal function worsens, recommend referral to Nephrology as outpatient 2. Ataxia and difficulty with gait with acute CVA 1. Patient underwent MRI brain with findings of acute CVA 2. Seen by Neurology. Discussed with Dr. Erlinda Hong. Plan to continue ASA and brilinta 3. Stable at present 3. Diabetes mellitus type 2 1. Long-acting insulin was initially held secondary to worsening renal function 2. Continued SSI coverage while in hospital 3. Would resume home insulin on discharge 4. Nausea vomiting and diarrhea 1. Continue supportive care for now with IVF 2. No further episodes this admission 3. Improved with IVF hydration 5. History of sleep apnea and narcolepsy on CPAP. 1. Continue CPAP as tolerated 6. Hypertension 1. Continued on PRN hydralazine 2. BP remains stable and controlled   Discharge Instructions  Discharge Instructions    Ambulatory referral to Occupational Therapy   Complete by:  As directed    Ambulatory referral to Physical Therapy   Complete by:  As directed      Allergies as of 01/22/2018   No Known Allergies     Medication List    STOP  taking these medications   clopidogrel 75 MG tablet Commonly known as:  PLAVIX   lisinopril 40 MG tablet Commonly known as:  PRINIVIL,ZESTRIL   metFORMIN 500 MG 24 hr tablet Commonly known as:  GLUCOPHAGE-XR     TAKE these medications   aspirin EC 81 MG tablet Take 81 mg by  mouth daily.   atorvastatin 80 MG tablet Commonly known as:  LIPITOR Take 1 tablet (80 mg total) by mouth daily at 6 PM.   benzonatate 100 MG capsule Commonly known as:  TESSALON Take 1 capsule (100 mg total) by mouth 3 (three) times daily as needed for cough.   Brinzolamide-Brimonidine 1-0.2 % Susp Place 1 drop into both eyes 3 (three) times daily.   cholecalciferol 1000 units tablet Commonly known as:  VITAMIN D Take 1,000 Units by mouth daily.   dorzolamide-timolol 22.3-6.8 MG/ML ophthalmic solution Commonly known as:  COSOPT Place 1 drop into the left eye 2 (two) times daily.   insulin aspart 100 UNIT/ML injection Commonly known as:  novoLOG Inject 10 Units into the skin 3 (three) times daily before meals.   insulin glargine 100 UNIT/ML injection Commonly known as:  LANTUS Inject 0.35 mLs (35 Units total) into the skin daily.   ticagrelor 90 MG Tabs tablet Commonly known as:  BRILINTA Take 1 tablet (90 mg total) by mouth 2 (two) times daily.   timolol 0.5 % ophthalmic gel-forming Commonly known as:  TIMOPTIC-XR Place 1 drop into both eyes daily.      Follow-up Information    [facility] Schedule an appointment as soon as possible for a visit in 4 week(s).        [facility] Follow up.   Specialty:  Rehabilitation Why:  You will be contacted this week to schedule an appointment for physical and occupational therapy.  Contact information: [address] [id number] mc [place]         No Known Allergies  Consults Neurology Discussed with Nephrology  Procedures/Studies: Ct Head Wo Contrast  Result Date: 01/18/2018 CLINICAL DATA:  Instability with multiple falls over the past 2 weeks. EXAM: CT HEAD WITHOUT CONTRAST TECHNIQUE: Contiguous axial images were obtained from the base of the skull through the vertex without intravenous contrast. COMPARISON:  04/15/2016 MRI  of the brain FINDINGS: Brain: Remote pontine infarct on the right with encephalomalacia. Remote infarcts in the right MCA territory with encephalomalacia involving the right frontal and parietal lobes as well as anterior right basal ganglia. No hydrocephalus. Mild ex vacuo dilatation of the frontal horn of the right lateral ventricle from the described infarcts. No acute intracranial hemorrhage, midline shift or edema. No extra-axial fluid. Vascular: No hyperdense vessel sign. Atherosclerosis of the carotid siphons bilaterally. Skull: Intact Sinuses/Orbits: Intact Other: None IMPRESSION: Remote infarcts of the right pons and MCA distribution. No acute intracranial abnormality. Electronically Signed   By: Ashley Royalty M.D.   On: 01/18/2018 20:54   [name] Head Wo Contrast  Result Date: 01/19/2018 CLINICAL DATA:  Progressive imbalance with difficulty walking. Punctate acute/subacute infarcts in the inferior right thalamic region on MRI. EXAM: MRA NECK WITHOUT CONTRAST MRA HEAD WITHOUT CONTRAST TECHNIQUE: Multiplanar and multiecho pulse sequences of the neck were obtained without intravenous contrast. Angiographic images of the neck were obtained using MRA technique without intravenous contast. Angiographic images of the Circle of Willis were obtained using MRA technique without intravenous contrast. COMPARISON:  Head MRA 04/15/2016.  Neck CTA 01/18/2016. FINDINGS: MRA  HEAD FINDINGS The visualized distal vertebral arteries are patent to the basilar with the left being dominant. Patent left PICA, bilateral AICA, and bilateral SCA origins are identified. The basilar artery is widely patent. There is a patent left posterior communicating artery. A right posterior communicating artery is also present though demonstrates diminished signal particularly proximally and distally which may reflect underlying atherosclerotic narrowing. There are severe tandem right P2 stenoses which have progressed from the prior MRA. There is  at most mild proximal left P2 stenosis. The internal carotid arteries are patent from skull base to carotid termini without stenosis on the left. There is moderate stenosis of the right supraclinoid ICA, less severe in appearance compared to the prior MRA. There is progressive narrowing of the right ICA terminus including a new severe ACA origin stenosis and a persistent severe MCA origin stenosis. ACAs and MCAs are patent without evidence of proximal branch occlusion. There is no significant left A1 or left M1 stenosis. A severe proximal left M2 inferior division stenosis is unchanged. No aneurysm is identified. MRA NECK FINDINGS Examination is mildly limited by motion artifact and noncontrast technique. The aortic arch and proximal common carotid arteries were not imaged. The included portion of the common carotid and cervical internal carotid arteries are patent without evidence of significant stenosis. Minimal narrowing of the distal right cervical ICA is similar to the prior neck CTA. The vertebral arteries are patent with antegrade flow bilaterally. The left vertebral artery is dominant without evidence of stenosis or dissection. No significant right vertebral artery stenosis is identified although the proximal V1 segment is suboptimally evaluated. IMPRESSION: 1. No large vessel occlusion. 2. Progressive intracranial atherosclerosis with severe stenoses involving the right P2 segment and right ACA and right MCA origins. Moderate right ICA supraclinoid and terminus stenoses. 3. Patent carotid and vertebral arteries in the neck without significant stenosis identified within mild limitations as detailed above. Electronically Signed   By: Logan Bores M.D.   On: 01/19/2018 10:55   [name] Neck Wo Contrast  Result Date: 01/19/2018 CLINICAL DATA:  Progressive imbalance with difficulty walking. Punctate acute/subacute infarcts in the inferior right thalamic region on MRI. EXAM: MRA NECK WITHOUT CONTRAST MRA HEAD  WITHOUT CONTRAST TECHNIQUE: Multiplanar and multiecho pulse sequences of the neck were obtained without intravenous contrast. Angiographic images of the neck were obtained using MRA technique without intravenous contast. Angiographic images of the Circle of Willis were obtained using MRA technique without intravenous contrast. COMPARISON:  Head MRA 04/15/2016.  Neck CTA 01/18/2016. FINDINGS: MRA HEAD FINDINGS The visualized distal vertebral arteries are patent to the basilar with the left being dominant. Patent left PICA, bilateral AICA, and bilateral SCA origins are identified. The basilar artery is widely patent. There is a patent left posterior communicating artery. A right posterior communicating artery is also present though demonstrates diminished signal particularly proximally and distally which may reflect underlying atherosclerotic narrowing. There are severe tandem right P2 stenoses which have progressed from the prior MRA. There is at most mild proximal left P2 stenosis. The internal carotid arteries are patent from skull base to carotid termini without stenosis on the left. There is moderate stenosis of the right supraclinoid ICA, less severe in appearance compared to the prior MRA. There is progressive narrowing of the right ICA terminus including a new severe ACA origin stenosis and a persistent severe MCA origin stenosis. ACAs and MCAs are patent without evidence of proximal branch occlusion. There is no significant left A1 or left M1 stenosis.  A severe proximal left M2 inferior division stenosis is unchanged. No aneurysm is identified. MRA NECK FINDINGS Examination is mildly limited by motion artifact and noncontrast technique. The aortic arch and proximal common carotid arteries were not imaged. The included portion of the common carotid and cervical internal carotid arteries are patent without evidence of significant stenosis. Minimal narrowing of the distal right cervical ICA is similar to the  prior neck CTA. The vertebral arteries are patent with antegrade flow bilaterally. The left vertebral artery is dominant without evidence of stenosis or dissection. No significant right vertebral artery stenosis is identified although the proximal V1 segment is suboptimally evaluated. IMPRESSION: 1. No large vessel occlusion. 2. Progressive intracranial atherosclerosis with severe stenoses involving the right P2 segment and right ACA and right MCA origins. Moderate right ICA supraclinoid and terminus stenoses. 3. Patent carotid and vertebral arteries in the neck without significant stenosis identified within mild limitations as detailed above. Electronically Signed   By: Logan Bores M.D.   On: 01/19/2018 10:55   Mr Brain Wo Contrast  Result Date: 01/19/2018 CLINICAL DATA:  57 y/o M; unwitnessed fall and ataxia. Difficulty walking for the past few weeks and increased falls. Ataxia, stroke suspected. EXAM: MRI HEAD WITHOUT CONTRAST TECHNIQUE: Multiplanar, multiecho pulse sequences of the brain and surrounding structures were obtained without intravenous contrast. COMPARISON:  01/18/2018 CT head.  04/15/2016 MRI head. FINDINGS: Brain: 3 punctate foci of reduced diffusion are present within the right inferior thalamus and right posterior hippocampus compatible with acute/early subacute infarction. No associated hemorrhage or mass effect. Stable large chronic infarct in the right MCA distribution, right anterior basal ganglia chronic lacunar infarct, and small chronic infarct in the right hemi pons with hemosiderin deposition. No extra-axial collection, hydrocephalus, focal mass effect, or herniation. Ex vacuo dilatation of the right lateral ventricle. Vascular: Normal flow voids. Skull and upper cervical spine: Normal marrow signal. Sinuses/Orbits: Mild diffuse paranasal sinus mucosal thickening. Partial opacification of the left mastoid air cells. Left intra-ocular lens replacement. Other: None. IMPRESSION: 1. 3  punctate foci of acute/early subacute infarction in the right inferior thalamus and right posterior hippocampus. No associated hemorrhage or mass effect. 2. Stable chronic infarcts in the right MCA distribution, right anterior basal ganglia, and right hemi pons. 3. Mild paranasal sinus disease. These results will be called to the ordering clinician or representative by the Radiologist Assistant, and communication documented in the PACS or zVision Dashboard. Electronically Signed   By: Kristine Garbe M.D.   On: 01/19/2018 02:49   US Abdomen Complete  Result Date: 01/19/2018 CLINICAL DATA:  Acute renal failure with nausea and vomiting EXAM: ABDOMEN ULTRASOUND COMPLETE COMPARISON:  None. FINDINGS: Gallbladder: No gallstones or wall thickening visualized. No sonographic Murphy sign noted by sonographer. Common bile duct: Diameter: 3 mm Liver: No focal lesion identified. Within normal limits in parenchymal echogenicity. Portal vein is patent on color Doppler imaging with normal direction of blood flow towards the liver. IVC: No abnormality visualized. Pancreas: Visualized portion unremarkable. Spleen: Size and appearance within normal limits. Right Kidney: Length: 11.5 cm. Echogenicity within normal limits. No mass or hydronephrosis visualized. Left Kidney: Length: 10 cm. Echogenicity within normal limits. No mass or hydronephrosis visualized. Abdominal aorta: No aneurysm visualized. IMPRESSION: Negative abdominal ultrasound. Electronically Signed   By: Monte Fantasia M.D.   On: 01/19/2018 12:49     Subjective: Reports feeling well. No complaints  Discharge Exam: Vitals:   01/22/18 0821 01/22/18 1248  BP: (!) 158/73 107/68  Pulse: 62 (!)  51  Resp:    Temp: 98.8 F (37.1 C) 97.7 F (36.5 C)  SpO2: 95% 100%   Vitals:   01/22/18 0340 01/22/18 0344 01/22/18 0821 01/22/18 1248  BP: 139/87  (!) 158/73 107/68  Pulse: (!) 56  62 (!) 51  Resp: 16     Temp: 98.8 F (37.1 C)  98.8 F (37.1  C) 97.7 F (36.5 C)  TempSrc: Oral  Oral Oral  SpO2: 99%  95% 100%  Weight:  87 kg    Height:        General: Pt is alert, awake, not in acute distress Cardiovascular: RRR, S1/S2 +, no rubs, no gallops Respiratory: CTA bilaterally, no wheezing, no rhonchi Abdominal: Soft, NT, ND, bowel sounds + Extremities: no edema, no cyanosis   The results of significant diagnostics from this hospitalization (including imaging, microbiology, ancillary and laboratory) are listed below for reference.     Microbiology: No results found for this or any previous visit (from the past 240 hour(s)).   Labs: BNP (last 3 results) No results for input(s): BNP in the last 8760 hours. Basic Metabolic Panel: Recent Labs  Lab 01/18/18 1818 01/19/18 0059 01/19/18 0634 01/20/18 0300 01/21/18 0420 01/22/18 0433  NA 138  --  138 133* 135 137  K 4.0  --  3.4* 3.6 3.5 3.6  CL 94*  --  97* 93* 97* 98  CO2 24  --  26 28 28 30   GLUCOSE 166*  --  205* 418* 284* 247*  BUN 84*  --  78* 73* 57* 39*  CREATININE 9.20* 9.02* 8.68* 6.37* 3.62* 2.46*  CALCIUM 9.2  --  8.2* 8.3* 8.3* 8.7*  MG  --   --   --  2.0  --   --   PHOS  --  5.1*  --   --   --   --    Liver Function Tests: Recent Labs  Lab 01/18/18 1818 01/19/18 0634  AST 22 15  ALT 12 12  ALKPHOS 75 58  BILITOT 0.7 0.5  PROT 8.2* 6.2*  ALBUMIN 4.1 3.3*   No results for input(s): LIPASE, AMYLASE in the last 168 hours. No results for input(s): AMMONIA in the last 168 hours. CBC: Recent Labs  Lab 01/18/18 1715 01/19/18 0059 01/19/18 0634  WBC 7.3 6.5 5.4  NEUTROABS 4.6  --  2.5  HGB 13.3 11.8* 11.3*  HCT 40.2 36.1* 33.9*  MCV 91.8 91.6 90.9  PLT 273 245 204   Cardiac Enzymes: Recent Labs  Lab 01/19/18 0059 01/19/18 0634 01/19/18 1132  CKTOTAL 144  --   --   TROPONINI <0.03 0.03* <0.03   BNP: Invalid input(s): POCBNP CBG: Recent Labs  Lab 01/21/18 1211 01/21/18 1724 01/21/18 2154 01/22/18 0818 01/22/18 1225  GLUCAP 327*  286* 273* 188* 220*   D-Dimer No results for input(s): DDIMER in the last 72 hours. Hgb A1c No results for input(s): HGBA1C in the last 72 hours. Lipid Profile No results for input(s): CHOL, HDL, LDLCALC, TRIG, CHOLHDL, LDLDIRECT in the last 72 hours. Thyroid function studies No results for input(s): TSH, T4TOTAL, T3FREE, THYROIDAB in the last 72 hours.  Invalid input(s): FREET3 Anemia work up No results for input(s): VITAMINB12, FOLATE, FERRITIN, TIBC, IRON, RETICCTPCT in the last 72 hours. Urinalysis    Component Value Date/Time   COLORURINE YELLOW 01/18/2018 2117   APPEARANCEUR HAZY (A) 01/18/2018 2117   LABSPEC 1.012 01/18/2018 2117   PHURINE 5.0 01/18/2018 2117   GLUCOSEU >=500 (A)  01/18/2018 2117   HGBUR NEGATIVE 01/18/2018 2117   [place] NEGATIVE 01/18/2018 2117   [place] NEGATIVE 01/18/2018 2117   PROTEINUR 30 (A) 01/18/2018 2117   UROBILINOGEN 0.2 02/16/2010 1154   NITRITE NEGATIVE 01/18/2018 2117   LEUKOCYTESUR NEGATIVE 01/18/2018 2117   Sepsis Labs Invalid input(s): PROCALCITONIN,  WBC,  LACTICIDVEN Microbiology No results found for this or any previous visit (from the past 240 hour(s)).  Time spent: 85min  SIGNED:   Marylu Lund, MD  [facility] 01/22/2018, 1:59 PM  If 7PM-7AM, please contact night-coverage

## 2018-01-22 NOTE — Progress Notes (Signed)
Occupational Therapy Treatment Patient Details Name: Cory Carpenter Due MRN: [id number] DOB: 04-30-1960 Today's Date: 01/22/2018    History of present illness Pt is a 57 y.o. male admitted 01/18/18 with nausea, vomiting, diarrhea and acute renal failure. Pt with recent onset of frequent falls. MRI revealed 3 small punctate acute/early subacute strokes in the right thalamus. PMH includes R CVA (12/2015, 03/2016), DM, glaucoma, narcolepsy.   OT comments  Pt making good progress and states he "feeles much better". Used RW during session and pt agreeable to using RW initially after DC. . Educated pt on strategies to reduce risk of falls. Pt verbalized understanding. Continue to recommend follow up with neruo outpt OT.   Follow Up Recommendations  Outpatient OT    Equipment Recommendations  RW   Recommendations for Other Services      Precautions / Restrictions Precautions Precautions: Fall Restrictions Weight Bearing Restrictions: No       Mobility Bed Mobility Overal bed mobility: Modified Independent Bed Mobility: Supine to Sit     Supine to sit: Modified independent (Device/Increase time)        Transfers Overall transfer level: Needs assistance Equipment used: Straight cane; RW Transfers: Sit to/from Stand Sit to Stand: Supervision  Feel pt is safer at this time with RW during ADL tasks           Balance Overall balance assessment: Needs assistance Sitting-balance support: No upper extremity supported;Feet supported Sitting balance-Leahy Scale: Good     Standing balance support: No upper extremity supported;During functional activity Standing balance-Leahy Scale: Fair                             ADL either performed or assessed with clinical judgement   ADL Overall ADL's : Needs assistance/impaired Eating/Feeding: Modified independent   Grooming: Set up;Standing   Upper Body Bathing: Supervision/ safety;Set up;Standing   Lower Body Bathing:  Supervison/ safety;Set up;Sit to/from stand   Upper Body Dressing : Set up;Sitting   Lower Body Dressing: Set up;Supervision/safety;Sit to/from stand   Toilet Transfer: Supervision/safety;Ambulation;RW           Functional mobility during ADLs: Supervision/safety;Rolling walker;Cueing for safety General ADL Comments: Pt agreeable to using RW     Vision       Perception     Praxis      Cognition Arousal/Alertness: Awake/alert Behavior During Therapy: Flat affect Overall Cognitive Status: No family/caregiver present to determine baseline cognitive functioning Area of Impairment: Safety/judgement;Awareness;Problem solving                         Safety/Judgement: Decreased awareness of safety;Decreased awareness of deficits Awareness: Emergent Problem Solving: Requires verbal cues General Comments: Poor awareness of safety with IV lines despite multiple cues/directions for safety        Exercises     Shoulder Instructions       General Comments      Pertinent Vitals/ Pain       Pain Assessment: No/denies pain  Home Living                                          Prior Functioning/Environment              Frequency  Min 2X/week        Progress Toward Goals  OT Goals(current  goals can now be found in the care plan section)  Progress towards OT goals: Progressing toward goals  Acute Rehab OT Goals Patient Stated Goal: return home OT Goal Formulation: With patient Time For Goal Achievement: 02/02/18 Potential to Achieve Goals: Good ADL Goals Pt Will Perform Grooming: with supervision;standing Pt Will Perform Upper Body Bathing: with supervision;sitting Pt Will Perform Lower Body Bathing: with supervision;sit to/from stand Pt Will Perform Upper Body Dressing: with supervision;sitting Pt Will Perform Lower Body Dressing: with supervision;sit to/from stand Pt Will Transfer to Toilet: with supervision;ambulating;regular  height toilet Pt Will Perform Toileting - Clothing Manipulation and hygiene: with supervision;sit to/from stand  Plan Discharge plan remains appropriate    Co-evaluation                 AM-PAC OT "6 Clicks" Daily Activity     Outcome Measure   Help from another person eating meals?: None Help from another person taking care of personal grooming?: None Help from another person toileting, which includes using toliet, bedpan, or urinal?: None Help from another person bathing (including washing, rinsing, drying)?: A Little Help from another person to put on and taking off regular upper body clothing?: None Help from another person to put on and taking off regular lower body clothing?: A Little 6 Click Score: 22    End of Session Equipment Utilized During Treatment: Rolling walker  OT Visit Diagnosis: Unsteadiness on feet (R26.81);Other abnormalities of gait and mobility (R26.89);Hemiplegia and hemiparesis;Other symptoms and signs involving cognitive function;Muscle weakness (generalized) (M62.81) Hemiplegia - Right/Left: Left Hemiplegia - dominant/non-dominant: Non-Dominant Hemiplegia - caused by: Cerebral infarction   Activity Tolerance Patient tolerated treatment well   Patient Left in bed;with call bell/phone within reach;with bed alarm set   Nurse Communication Mobility status;Other (comment)(DC questions per pt)        Time: 4628-6381 OT Time Calculation (min): 19 min  Charges: OT General Charges $OT Visit: 1 Visit OT Treatments $Self Care/Home Management : 8-22 mins  Maurie Boettcher, OT/L   Acute OT Clinical Specialist [place] Pager [phone] Office [phone]    [facility] 01/22/2018, 3:55 PM

## 2018-01-22 NOTE — Progress Notes (Signed)
Nsg Discharge Note  Admit Date:  01/18/2018 Discharge date: 01/22/2018   Cory Carpenter to be D/C'd Home per MD order.  AVS completed.  Copy for chart, and copy for patient signed, and dated. Patient/caregiver able to verbalize understanding.  Discharge Medication: Allergies as of 01/22/2018   No Known Allergies     Medication List    STOP taking these medications   clopidogrel 75 MG tablet Commonly known as:  PLAVIX   lisinopril 40 MG tablet Commonly known as:  PRINIVIL,ZESTRIL   metFORMIN 500 MG 24 hr tablet Commonly known as:  GLUCOPHAGE-XR     TAKE these medications   aspirin EC 81 MG tablet Take 81 mg by mouth daily.   atorvastatin 80 MG tablet Commonly known as:  LIPITOR Take 1 tablet (80 mg total) by mouth daily at 6 PM.   benzonatate 100 MG capsule Commonly known as:  TESSALON Take 1 capsule (100 mg total) by mouth 3 (three) times daily as needed for cough.   Brinzolamide-Brimonidine 1-0.2 % Susp Place 1 drop into both eyes 3 (three) times daily.   cholecalciferol 1000 units tablet Commonly known as:  VITAMIN D Take 1,000 Units by mouth daily.   dorzolamide-timolol 22.3-6.8 MG/ML ophthalmic solution Commonly known as:  COSOPT Place 1 drop into the left eye 2 (two) times daily.   INS SYRINGE/NEEDLE .5CC/28G 28G X 1/2" 0.5 ML Misc 1 Device by Does not apply route 3 (three) times daily.   insulin aspart 100 UNIT/ML injection Commonly known as:  novoLOG Inject 10 Units into the skin 3 (three) times daily before meals.   insulin glargine 100 UNIT/ML injection Commonly known as:  LANTUS Inject 0.35 mLs (35 Units total) into the skin daily.   ticagrelor 90 MG Tabs tablet Commonly known as:  BRILINTA Take 1 tablet (90 mg total) by mouth 2 (two) times daily.   timolol 0.5 % ophthalmic gel-forming Commonly known as:  TIMOPTIC-XR Place 1 drop into both eyes daily.       Discharge Assessment: Vitals:   01/22/18 0821 01/22/18 1248  BP: (!) 158/73  107/68  Pulse: 62 (!) 51  Resp:    Temp: 98.8 F (37.1 C) 97.7 F (36.5 C)  SpO2: 95% 100%   Skin clean, dry and intact without evidence of skin break down, no evidence of skin tears noted. IV catheter discontinued intact. Site without signs and symptoms of complications - no redness or edema noted at insertion site, patient denies c/o pain - only slight tenderness at site.  Dressing with slight pressure applied.  D/c Instructions-Education: Discharge instructions given to patient/family with verbalized understanding. D/c education completed with patient/family including follow up instructions, medication list, d/c activities limitations if indicated, with other d/c instructions as indicated by MD - patient able to verbalize understanding, all questions fully answered. Patient instructed to return to ED, call 911, or call MD for any changes in condition.  Patient escorted via [place], and D/C home via private auto.  Hiram Comber, RN 01/22/2018 4:05 PM

## 2018-01-22 NOTE — Progress Notes (Signed)
Inpatient Diabetes Program Recommendations  AACE/ADA: New Consensus Statement on Inpatient Glycemic Control (2015)  Target Ranges:  Prepandial:   less than 140 mg/dL      Peak postprandial:   less than 180 mg/dL (1-2 hours)      Critically ill patients:  140 - 180 mg/dL   Lab Results  Component Value Date   GLUCAP 220 (H) 01/22/2018   HGBA1C 10.7 (H) 01/19/2018    Review of Glycemic Control Results for AZAIAH, LICCIARDI (MRN [id number]) as of 01/22/2018 14:53  Ref. Range 01/21/2018 21:54 01/22/2018 08:18 01/22/2018 12:25  Glucose-Capillary Latest Ref Range: 70 - 99 mg/dL 273 (H) 188 (H) 220 (H)   Diabetes history: Type 2 DM Outpatient Diabetes medications: Lantus 35 units QD, Metformin 1000 mg BID, Novolog 10 units TID Current orders for Inpatient glycemic control: Novolog 10 units TID, Novolog 0-15 units TID, Novolog 0-5 units QHS, Lantus 30 units QD  Inpatient Diabetes Program Recommendations:    Spoke with patient regarding outpatient diabetes management. Patient had flat affect and eyes closed during majority of conversation but responded appropriately to questions.   Reviewed patient's current A1c of 10.7%. Explained what a A1c is and what it measures. Also reviewed goal A1c with patient, importance of good glucose control @ home, and blood sugar goals. Reviewed patho of DM, need for insulin, vascular changes and comorbidites.   Patient checks blood sugar 3 times per day and glucose runs "high 200's". MD recently decreased Lantus dose from 40 down to 35 units QD due to patient having hypoglycemia. However, patient admits to drinking large quantities of juice 4-5  8 oz cups/day. Patient admits to being stubborn and that his wife also discourages these nutritional decisions. Patient admits to hiding this behavior from wife. He is not willing to go to outpatient education or to speak with dietitian. He states, "I have had diabetes since 1998 and I know what I am supposed to do. I just have  to decide if I am going to do it." We further discussed the benefit of decreasing juice intake and being mindful of sugar intake and the impact this would make on overall health. Patient refuses to carb count.  Discussed benefit of seeing an endocrinologist to help further adjust insulin. Patient refuses. Wants to continue care at [facility].  Patient has no further questions regarding DM at this time.   Thanks, Bronson Curb, MSN, RNC-OB Diabetes Coordinator [phone] (8a-5p)

## 2018-01-22 NOTE — Progress Notes (Signed)
Physical Therapy Treatment Patient Details Name: Cory Carpenter MRN: [id number] DOB: 11-14-1960 Today's Date: 01/22/2018    History of Present Illness Pt is a 57 y.o. male admitted 01/18/18 with nausea, vomiting, diarrhea and acute renal failure. Pt with recent onset of frequent falls. MRI revealed 3 small punctate acute/early subacute strokes in the right thalamus. PMH includes R CVA (12/2015, 03/2016), DM, glaucoma, narcolepsy.   PT Comments    Pt progressing with mobility. Ambulatory with SPC and min guard, progressing to supervision for safety due to continued instability. Discussed potential to use RW for added stability, pt does not seem interested in this. Continues to demonstrate decreased awareness and insight into deficits. Will follow acutely.    Follow Up Recommendations  Outpatient PT;Supervision/Assistance - 24 hour     Equipment Recommendations  None recommended by PT    Recommendations for Other Services       Precautions / Restrictions Precautions Precautions: Fall Restrictions Weight Bearing Restrictions: No    Mobility  Bed Mobility Overal bed mobility: Needs Assistance Bed Mobility: Supine to Sit     Supine to sit: Modified independent (Device/Increase time)        Transfers Overall transfer level: Needs assistance Equipment used: Straight cane Transfers: Sit to/from Stand Sit to Stand: Supervision         General transfer comment: Cues for safety with lines  Ambulation/Gait Ambulation/Gait assistance: Min guard;Supervision Gait Distance (Feet): 350 Feet Assistive device: Straight cane Gait Pattern/deviations: Step-through pattern;Decreased stride length;Staggering right;Staggering left Gait velocity: Decreased   General Gait Details: Stability improved ambulating with SPC this session compared to last treatment. Continues to demonstrate intermittent staggering R/L, but able to self-correct with min guard for balance. SPC seemed too tall for  pt, but pt declining adjustment   Stairs             Wheelchair Mobility    Modified Rankin (Stroke Patients Only) Modified Rankin (Stroke Patients Only) Pre-Morbid Rankin Score: Slight disability Modified Rankin: Moderately severe disability     Balance Overall balance assessment: Needs assistance Sitting-balance support: No upper extremity supported;Feet supported Sitting balance-Leahy Scale: Good     Standing balance support: No upper extremity supported;During functional activity Standing balance-Leahy Scale: Fair                              Cognition Arousal/Alertness: Awake/alert Behavior During Therapy: Impulsive Overall Cognitive Status: No family/caregiver present to determine baseline cognitive functioning Area of Impairment: Safety/judgement;Awareness;Problem solving                         Safety/Judgement: Decreased awareness of safety;Decreased awareness of deficits Awareness: Emergent Problem Solving: Requires verbal cues General Comments: Poor awareness of safety with IV lines despite multiple cues/directions for safety      Exercises      General Comments        Pertinent Vitals/Pain Pain Assessment: No/denies pain    Home Living                      Prior Function            PT Goals (current goals can now be found in the care plan section) Acute Rehab PT Goals Patient Stated Goal: return home PT Goal Formulation: With patient Time For Goal Achievement: 01/26/18 Potential to Achieve Goals: Good Progress towards PT goals: Progressing toward goals    Frequency  Min 4X/week      PT Plan Current plan remains appropriate    Co-evaluation              AM-PAC PT "6 Clicks" Mobility   Outcome Measure  Help needed turning from your back to your side while in a flat bed without using bedrails?: None Help needed moving from lying on your back to sitting on the side of a flat bed without  using bedrails?: None Help needed moving to and from a bed to a chair (including a wheelchair)?: A Little Help needed standing up from a chair using your arms (e.g., wheelchair or bedside chair)?: A Little Help needed to walk in hospital room?: A Little Help needed climbing 3-5 steps with a railing? : A Little 6 Click Score: 20    End of Session Equipment Utilized During Treatment: Gait belt Activity Tolerance: Patient tolerated treatment well Patient left: in bed;with call bell/phone within reach;with bed alarm set Nurse Communication: Mobility status PT Visit Diagnosis: Unsteadiness on feet (R26.81);Other abnormalities of gait and mobility (R26.89)     Time: 0076-2263 PT Time Calculation (min) (ACUTE ONLY): 15 min  Charges:  $Gait Training: 8-22 mins                    Mabeline Caras, PT, DPT Acute Rehabilitation Services  Pager [phone] Office [place] 01/22/2018, 12:51 PM

## 2021-05-15 ENCOUNTER — Encounter (HOSPITAL_COMMUNITY): Payer: Self-pay

## 2021-05-15 ENCOUNTER — Inpatient Hospital Stay (HOSPITAL_COMMUNITY)
Admission: EM | Admit: 2021-05-15 | Discharge: 2021-05-17 | DRG: 638 | Disposition: A | Discharge status: Home Health | Payer: Medicare Other | Attending: Internal Medicine | Admitting: Internal Medicine

## 2021-05-15 ENCOUNTER — Emergency Department (HOSPITAL_COMMUNITY): Payer: Medicare Other

## 2021-05-15 ENCOUNTER — Other Ambulatory Visit: Payer: Self-pay

## 2021-05-15 DIAGNOSIS — Z794 Long term (current) use of insulin: Secondary | ICD-10-CM | POA: Diagnosis not present

## 2021-05-15 DIAGNOSIS — Z823 Family history of stroke: Secondary | ICD-10-CM

## 2021-05-15 DIAGNOSIS — G4730 Sleep apnea, unspecified: Secondary | ICD-10-CM | POA: Diagnosis present

## 2021-05-15 DIAGNOSIS — E871 Hypo-osmolality and hyponatremia: Secondary | ICD-10-CM | POA: Diagnosis present

## 2021-05-15 DIAGNOSIS — N289 Disorder of kidney and ureter, unspecified: Secondary | ICD-10-CM

## 2021-05-15 DIAGNOSIS — Z7902 Long term (current) use of antithrombotics/antiplatelets: Secondary | ICD-10-CM

## 2021-05-15 DIAGNOSIS — R935 Abnormal findings on diagnostic imaging of other abdominal regions, including retroperitoneum: Secondary | ICD-10-CM | POA: Diagnosis present

## 2021-05-15 DIAGNOSIS — Z8673 Personal history of transient ischemic attack (TIA), and cerebral infarction without residual deficits: Secondary | ICD-10-CM | POA: Diagnosis not present

## 2021-05-15 DIAGNOSIS — Z87891 Personal history of nicotine dependence: Secondary | ICD-10-CM | POA: Diagnosis not present

## 2021-05-15 DIAGNOSIS — I10 Essential (primary) hypertension: Secondary | ICD-10-CM | POA: Diagnosis present

## 2021-05-15 DIAGNOSIS — H409 Unspecified glaucoma: Secondary | ICD-10-CM | POA: Diagnosis present

## 2021-05-15 DIAGNOSIS — E1110 Type 2 diabetes mellitus with ketoacidosis without coma: Principal | ICD-10-CM | POA: Diagnosis present

## 2021-05-15 DIAGNOSIS — N179 Acute kidney failure, unspecified: Secondary | ICD-10-CM | POA: Diagnosis present

## 2021-05-15 DIAGNOSIS — Z833 Family history of diabetes mellitus: Secondary | ICD-10-CM | POA: Diagnosis not present

## 2021-05-15 DIAGNOSIS — Z7982 Long term (current) use of aspirin: Secondary | ICD-10-CM

## 2021-05-15 DIAGNOSIS — G47419 Narcolepsy without cataplexy: Secondary | ICD-10-CM | POA: Diagnosis present

## 2021-05-15 DIAGNOSIS — Z9114 Patient's other noncompliance with medication regimen: Secondary | ICD-10-CM | POA: Diagnosis not present

## 2021-05-15 DIAGNOSIS — Z79899 Other long term (current) drug therapy: Secondary | ICD-10-CM

## 2021-05-15 DIAGNOSIS — R748 Abnormal levels of other serum enzymes: Secondary | ICD-10-CM

## 2021-05-15 DIAGNOSIS — E1010 Type 1 diabetes mellitus with ketoacidosis without coma: Principal | ICD-10-CM

## 2021-05-15 LAB — URINALYSIS, ROUTINE W REFLEX MICROSCOPIC
Bacteria, UA: NONE SEEN
Bilirubin Urine: NEGATIVE
Glucose, UA: >=500 mg/dL — AB
Ketones, ur: 80 mg/dL — AB
Leukocytes,Ua: NEGATIVE
Nitrite: NEGATIVE
Protein, ur: NEGATIVE mg/dL
Specific Gravity, Urine: 1.018 (ref 1.005–1.030)
pH: 5 (ref 5.0–8.0)

## 2021-05-15 LAB — CBC WITH DIFFERENTIAL/PLATELET
Abs Immature Granulocytes: 0.02 10*3/uL (ref 0.00–0.07)
Basophils Absolute: 0 10*3/uL (ref 0.0–0.1)
Basophils Relative: 1 %
Eosinophils Absolute: 0 10*3/uL (ref 0.0–0.5)
Eosinophils Relative: 0 %
HCT: 46.8 % (ref 39.0–52.0)
Hemoglobin: 14.9 g/dL (ref 13.0–17.0)
Immature Granulocytes: 0 %
Lymphocytes Relative: 14 %
Lymphs Abs: 1.2 10*3/uL (ref 0.7–4.0)
MCH: 30.2 pg (ref 26.0–34.0)
MCHC: 31.8 g/dL (ref 30.0–36.0)
MCV: 94.7 fL (ref 80.0–100.0)
Monocytes Absolute: 0.4 10*3/uL (ref 0.1–1.0)
Monocytes Relative: 5 %
Neutro Abs: 7 10*3/uL (ref 1.7–7.7)
Neutrophils Relative %: 80 %
Platelets: 276 10*3/uL (ref 150–400)
RBC: 4.94 MIL/uL (ref 4.22–5.81)
RDW: 11.3 % — ABNORMAL LOW (ref 11.5–15.5)
WBC: 8.7 10*3/uL (ref 4.0–10.5)
nRBC: 0 % (ref 0.0–0.2)

## 2021-05-15 LAB — SAMPLE TO BLOOD BANK

## 2021-05-15 LAB — CBG MONITORING, ED: Glucose-Capillary: >600 mg/dL (ref 70–99)

## 2021-05-15 LAB — PROTIME-INR
INR: 1 (ref 0.8–1.2)
Prothrombin Time: 13.6 seconds (ref 11.4–15.2)

## 2021-05-15 NOTE — ED Provider Triage Note (Signed)
Emergency Medicine Provider Triage Evaluation Note ? ?Cory Carpenter , a 61 y.o. male  was evaluated in triage.  Pt complains of poor appetite and abdominal pain.  He has had a hard time eating.  He has been vomiting once today and once here with some blood in it.  He has pain in his chest that he calls indigestion.   ? ? ?Physical Exam  ?BP (!) 179/72 (BP Location: Left Arm)   Pulse 94   Temp 98.6 ?F (37 ?C) (Oral)   Resp 18   SpO2 100%  ?Gen:   Awake, no distress   ?Resp:  Normal effort  ?MSK:   Moves extremities without difficulty  ?Other:  Normal speech. Abdomen is soft, non tender, non distended.  ? ?Medical Decision Making  ?Medically screening exam initiated at 9:58 PM.  Appropriate orders placed.  Hezzie Karim was informed that the remainder of the evaluation will be completed by another provider, this initial triage assessment does not replace that evaluation, and the importance of remaining in the ED until their evaluation is complete. ? ? ?  ?Lorin Glass, PA-C ?05/15/21 2202 ? ?

## 2021-05-15 NOTE — ED Triage Notes (Signed)
For 2 days pt has been having indigestion symptoms, burning in chest, today he has been weak and not able to eat. Started vomiting just prior to arrival and the vomitus has blood in it. ?

## 2021-05-16 DIAGNOSIS — I10 Essential (primary) hypertension: Secondary | ICD-10-CM | POA: Diagnosis not present

## 2021-05-16 DIAGNOSIS — N179 Acute kidney failure, unspecified: Secondary | ICD-10-CM | POA: Diagnosis not present

## 2021-05-16 DIAGNOSIS — R935 Abnormal findings on diagnostic imaging of other abdominal regions, including retroperitoneum: Secondary | ICD-10-CM | POA: Diagnosis present

## 2021-05-16 DIAGNOSIS — Z8673 Personal history of transient ischemic attack (TIA), and cerebral infarction without residual deficits: Secondary | ICD-10-CM

## 2021-05-16 DIAGNOSIS — E1110 Type 2 diabetes mellitus with ketoacidosis without coma: Secondary | ICD-10-CM | POA: Diagnosis not present

## 2021-05-16 LAB — BASIC METABOLIC PANEL
Anion gap: 27 — ABNORMAL HIGH (ref 5–15)
Anion gap: 13 (ref 5–15)
Anion gap: 12 (ref 5–15)
Anion gap: 13 (ref 5–15)
BUN: 42 mg/dL — ABNORMAL HIGH (ref 6–20)
BUN: 33 mg/dL — ABNORMAL HIGH (ref 6–20)
BUN: 33 mg/dL — ABNORMAL HIGH (ref 6–20)
BUN: 30 mg/dL — ABNORMAL HIGH (ref 6–20)
CO2: 11 mmol/L — ABNORMAL LOW (ref 22–32)
CO2: 24 mmol/L (ref 22–32)
CO2: 28 mmol/L (ref 22–32)
CO2: 28 mmol/L (ref 22–32)
Calcium: 7.9 mg/dL — ABNORMAL LOW (ref 8.9–10.3)
Calcium: 8.7 mg/dL — ABNORMAL LOW (ref 8.9–10.3)
Calcium: 9.1 mg/dL (ref 8.9–10.3)
Calcium: 9.1 mg/dL (ref 8.9–10.3)
Chloride: 96 mmol/L — ABNORMAL LOW (ref 98–111)
Chloride: 103 mmol/L (ref 98–111)
Chloride: 105 mmol/L (ref 98–111)
Chloride: 103 mmol/L (ref 98–111)
Creatinine, Ser: 2.97 mg/dL — ABNORMAL HIGH (ref 0.61–1.24)
Creatinine, Ser: 2.08 mg/dL — ABNORMAL HIGH (ref 0.61–1.24)
Creatinine, Ser: 2 mg/dL — ABNORMAL HIGH (ref 0.61–1.24)
Creatinine, Ser: 1.94 mg/dL — ABNORMAL HIGH (ref 0.61–1.24)
GFR, Estimated: 23 mL/min — ABNORMAL LOW (ref >60)
GFR, Estimated: 36 mL/min — ABNORMAL LOW (ref >60)
GFR, Estimated: 38 mL/min — ABNORMAL LOW (ref >60)
GFR, Estimated: 39 mL/min — ABNORMAL LOW (ref >60)
Glucose, Bld: 810 mg/dL (ref 70–99)
Glucose, Bld: 318 mg/dL — ABNORMAL HIGH (ref 70–99)
Glucose, Bld: 192 mg/dL — ABNORMAL HIGH (ref 70–99)
Glucose, Bld: 249 mg/dL — ABNORMAL HIGH (ref 70–99)
Potassium: 4.3 mmol/L (ref 3.5–5.1)
Potassium: 3.5 mmol/L (ref 3.5–5.1)
Potassium: 3.7 mmol/L (ref 3.5–5.1)
Potassium: 3.6 mmol/L (ref 3.5–5.1)
Sodium: 134 mmol/L — ABNORMAL LOW (ref 135–145)
Sodium: 140 mmol/L (ref 135–145)
Sodium: 145 mmol/L (ref 135–145)
Sodium: 144 mmol/L (ref 135–145)

## 2021-05-16 LAB — CBG MONITORING, ED
Glucose-Capillary: >600 mg/dL (ref 70–99)
Glucose-Capillary: >600 mg/dL (ref 70–99)
Glucose-Capillary: >600 mg/dL (ref 70–99)
Glucose-Capillary: >600 mg/dL (ref 70–99)
Glucose-Capillary: >600 mg/dL (ref 70–99)
Glucose-Capillary: >600 mg/dL (ref 70–99)
Glucose-Capillary: >600 mg/dL (ref 70–99)
Glucose-Capillary: 566 mg/dL (ref 70–99)
Glucose-Capillary: 589 mg/dL (ref 70–99)
Glucose-Capillary: 467 mg/dL — ABNORMAL HIGH (ref 70–99)
Glucose-Capillary: 491 mg/dL — ABNORMAL HIGH (ref 70–99)
Glucose-Capillary: 466 mg/dL — ABNORMAL HIGH (ref 70–99)
Glucose-Capillary: 394 mg/dL — ABNORMAL HIGH (ref 70–99)
Glucose-Capillary: 409 mg/dL — ABNORMAL HIGH (ref 70–99)
Glucose-Capillary: 361 mg/dL — ABNORMAL HIGH (ref 70–99)
Glucose-Capillary: 312 mg/dL — ABNORMAL HIGH (ref 70–99)
Glucose-Capillary: 234 mg/dL — ABNORMAL HIGH (ref 70–99)
Glucose-Capillary: 203 mg/dL — ABNORMAL HIGH (ref 70–99)

## 2021-05-16 LAB — COMPREHENSIVE METABOLIC PANEL
ALT: 22 U/L (ref 0–44)
AST: 24 U/L (ref 15–41)
Albumin: 4.2 g/dL (ref 3.5–5.0)
Alkaline Phosphatase: 105 U/L (ref 38–126)
Anion gap: 35 — ABNORMAL HIGH (ref 5–15)
BUN: 39 mg/dL — ABNORMAL HIGH (ref 6–20)
CO2: 10 mmol/L — ABNORMAL LOW (ref 22–32)
Calcium: 9.5 mg/dL (ref 8.9–10.3)
Chloride: 87 mmol/L — ABNORMAL LOW (ref 98–111)
Creatinine, Ser: 2.77 mg/dL — ABNORMAL HIGH (ref 0.61–1.24)
GFR, Estimated: 25 mL/min — ABNORMAL LOW (ref >60)
Glucose, Bld: 804 mg/dL (ref 70–99)
Potassium: 5.3 mmol/L — ABNORMAL HIGH (ref 3.5–5.1)
Sodium: 132 mmol/L — ABNORMAL LOW (ref 135–145)
Total Bilirubin: 1.1 mg/dL (ref 0.3–1.2)
Total Protein: 8.1 g/dL (ref 6.5–8.1)

## 2021-05-16 LAB — I-STAT VENOUS BLOOD GAS, ED
Acid-base deficit: 15 mmol/L — ABNORMAL HIGH (ref 0.0–2.0)
Bicarbonate: 11.8 mmol/L — ABNORMAL LOW (ref 20.0–28.0)
Calcium, Ion: 1.09 mmol/L — ABNORMAL LOW (ref 1.15–1.40)
HCT: 43 % (ref 39.0–52.0)
Hemoglobin: 14.6 g/dL (ref 13.0–17.0)
O2 Saturation: 90 %
Potassium: 6.8 mmol/L (ref 3.5–5.1)
Sodium: 126 mmol/L — ABNORMAL LOW (ref 135–145)
TCO2: 13 mmol/L — ABNORMAL LOW (ref 22–32)
pCO2, Ven: 30 mmHg — ABNORMAL LOW (ref 44–60)
pH, Ven: 7.202 — ABNORMAL LOW (ref 7.25–7.43)
pO2, Ven: 70 mmHg — ABNORMAL HIGH (ref 32–45)

## 2021-05-16 LAB — LIPASE, BLOOD: Lipase: 25 U/L (ref 11–51)

## 2021-05-16 LAB — BETA-HYDROXYBUTYRIC ACID
Beta-Hydroxybutyric Acid: >8 mmol/L — ABNORMAL HIGH (ref 0.05–0.27)
Beta-Hydroxybutyric Acid: 2.7 mmol/L — ABNORMAL HIGH (ref 0.05–0.27)
Beta-Hydroxybutyric Acid: 0.72 mmol/L — ABNORMAL HIGH (ref 0.05–0.27)

## 2021-05-16 LAB — LACTIC ACID, PLASMA
Lactic Acid, Venous: 4.1 mmol/L (ref 0.5–1.9)
Lactic Acid, Venous: 2.9 mmol/L (ref 0.5–1.9)
Lactic Acid, Venous: 2.7 mmol/L (ref 0.5–1.9)

## 2021-05-16 LAB — GLUCOSE, CAPILLARY
Glucose-Capillary: 177 mg/dL — ABNORMAL HIGH (ref 70–99)
Glucose-Capillary: 157 mg/dL — ABNORMAL HIGH (ref 70–99)
Glucose-Capillary: 153 mg/dL — ABNORMAL HIGH (ref 70–99)
Glucose-Capillary: 227 mg/dL — ABNORMAL HIGH (ref 70–99)

## 2021-05-16 LAB — TROPONIN I (HIGH SENSITIVITY)
Troponin I (High Sensitivity): 10 ng/L (ref <18)
Troponin I (High Sensitivity): 12 ng/L (ref <18)

## 2021-05-16 LAB — HIV ANTIBODY (ROUTINE TESTING W REFLEX): HIV Screen 4th Generation wRfx: NONREACTIVE

## 2021-05-16 MED ORDER — LACTATED RINGERS IV BOLUS
20.0000 mL/kg | Freq: Once | INTRAVENOUS | Status: AC
  Administered 2021-05-16: 1796 mL via INTRAVENOUS

## 2021-05-16 MED ORDER — PANTOPRAZOLE SODIUM 40 MG IV SOLR
40.0000 mg | Freq: Once | INTRAVENOUS | Status: AC
  Administered 2021-05-16: 40 mg via INTRAVENOUS
  Filled 2021-05-16: qty 10

## 2021-05-16 MED ORDER — DEXTROSE 50 % IV SOLN: 0.0000 mL | INTRAVENOUS | Status: DC | PRN

## 2021-05-16 MED ORDER — LIVING WELL WITH DIABETES BOOK
Freq: Once | Status: AC
  Filled 2021-05-16: qty 1

## 2021-05-16 MED ORDER — DEXTROSE IN LACTATED RINGERS 5 % IV SOLN: INTRAVENOUS | Status: DC

## 2021-05-16 MED ORDER — ENSURE ENLIVE PO LIQD
237.0000 mL | Freq: Two times a day (BID) | ORAL | Status: DC
  Administered 2021-05-17: 237 mL via ORAL

## 2021-05-16 MED ORDER — TICAGRELOR 90 MG PO TABS
90.0000 mg | ORAL_TABLET | Freq: Two times a day (BID) | ORAL | Status: DC
  Administered 2021-05-16 – 2021-05-17 (×3): 90 mg via ORAL
  Filled 2021-05-16 (×4): qty 1

## 2021-05-16 MED ORDER — ATORVASTATIN CALCIUM 10 MG PO TABS
5.0000 mg | ORAL_TABLET | Freq: Every day | ORAL | Status: DC
  Administered 2021-05-16: 5 mg via ORAL
  Filled 2021-05-16: qty 1

## 2021-05-16 MED ORDER — NETARSUDIL-LATANOPROST 0.02-0.005 % OP SOLN: 1.0000 [drp] | Freq: Every evening | OPHTHALMIC | Status: DC

## 2021-05-16 MED ORDER — INSULIN GLARGINE-YFGN 100 UNIT/ML ~~LOC~~ SOLN
34.0000 [IU] | Freq: Every day | SUBCUTANEOUS | Status: DC
  Administered 2021-05-16: 34 [IU] via SUBCUTANEOUS
  Filled 2021-05-16 (×2): qty 0.34

## 2021-05-16 MED ORDER — ONDANSETRON HCL 4 MG/2ML IJ SOLN
4.0000 mg | Freq: Once | INTRAMUSCULAR | Status: AC
  Administered 2021-05-16: 4 mg via INTRAVENOUS
  Filled 2021-05-16: qty 2

## 2021-05-16 MED ORDER — BRINZOLAMIDE 1 % OP SUSP
1.0000 [drp] | Freq: Three times a day (TID) | OPHTHALMIC | Status: DC
  Administered 2021-05-16 – 2021-05-17 (×3): 1 [drp] via OPHTHALMIC
  Filled 2021-05-16: qty 10

## 2021-05-16 MED ORDER — LACTATED RINGERS IV BOLUS
1000.0000 mL | Freq: Once | INTRAVENOUS | Status: AC
  Administered 2021-05-16: 1000 mL via INTRAVENOUS

## 2021-05-16 MED ORDER — LACTATED RINGERS IV SOLN: INTRAVENOUS | Status: DC

## 2021-05-16 MED ORDER — HEPARIN SODIUM (PORCINE) 5000 UNIT/ML IJ SOLN
5000.0000 [IU] | Freq: Three times a day (TID) | INTRAMUSCULAR | Status: DC
  Administered 2021-05-16 – 2021-05-17 (×5): 5000 [IU] via SUBCUTANEOUS
  Filled 2021-05-16 (×5): qty 1

## 2021-05-16 MED ORDER — INSULIN ASPART 100 UNIT/ML IJ SOLN
4.0000 [IU] | Freq: Three times a day (TID) | INTRAMUSCULAR | Status: DC
Start: 2021-05-16 — End: 2021-05-17
  Administered 2021-05-17 (×2): 4 [IU] via SUBCUTANEOUS

## 2021-05-16 MED ORDER — INSULIN ASPART 100 UNIT/ML IJ SOLN
0.0000 [IU] | Freq: Three times a day (TID) | INTRAMUSCULAR | Status: DC
  Administered 2021-05-16: 3 [IU] via SUBCUTANEOUS
  Administered 2021-05-17: 5 [IU] via SUBCUTANEOUS
  Administered 2021-05-17: 8 [IU] via SUBCUTANEOUS

## 2021-05-16 MED ORDER — INSULIN REGULAR(HUMAN) IN NACL 100-0.9 UT/100ML-% IV SOLN
INTRAVENOUS | Status: DC
  Administered 2021-05-16: 8 [IU]/h via INTRAVENOUS
  Administered 2021-05-16: 11 [IU]/h via INTRAVENOUS
  Filled 2021-05-16 (×2): qty 100

## 2021-05-16 MED ORDER — BRIMONIDINE TARTRATE 0.2 % OP SOLN
1.0000 [drp] | Freq: Three times a day (TID) | OPHTHALMIC | Status: DC
  Administered 2021-05-16 – 2021-05-17 (×3): 1 [drp] via OPHTHALMIC
  Filled 2021-05-16: qty 5

## 2021-05-16 MED ORDER — INSULIN ASPART 100 UNIT/ML IJ SOLN
0.0000 [IU] | Freq: Every day | INTRAMUSCULAR | Status: DC
  Administered 2021-05-16: 2 [IU] via SUBCUTANEOUS

## 2021-05-16 MED ORDER — INSULIN ASPART 100 UNIT/ML IJ SOLN
6.0000 [IU] | Freq: Three times a day (TID) | INTRAMUSCULAR | Status: DC
  Administered 2021-05-16: 6 [IU] via SUBCUTANEOUS

## 2021-05-16 MED ORDER — STERILE WATER FOR INJECTION IJ SOLN
INTRAMUSCULAR | Status: AC
Start: 2021-05-16 — End: 2021-05-16
  Filled 2021-05-16: qty 10

## 2021-05-16 MED ORDER — TIMOLOL MALEATE 0.5 % OP SOLN
1.0000 [drp] | Freq: Every day | OPHTHALMIC | Status: DC
  Administered 2021-05-17: 1 [drp] via OPHTHALMIC
  Filled 2021-05-16: qty 5

## 2021-05-16 MED ORDER — SODIUM CHLORIDE 0.9 % IV BOLUS
1796.0000 mL | Freq: Once | INTRAVENOUS | Status: AC
  Administered 2021-05-16: 1796 mL via INTRAVENOUS

## 2021-05-16 MED ORDER — ASPIRIN EC 81 MG PO TBEC
81.0000 mg | DELAYED_RELEASE_TABLET | Freq: Every day | ORAL | Status: DC
  Administered 2021-05-16 – 2021-05-17 (×2): 81 mg via ORAL
  Filled 2021-05-16 (×2): qty 1

## 2021-05-16 NOTE — ED Provider Notes (Signed)
?[facility] ?Provider Note ? ? ?CSN: [id number] ?Arrival date & time: 05/15/21  1933 ? ?  ? ?History ? ?Chief Complaint  ?Patient presents with  ? Abdominal Pain  ? ? ?Cory Carpenter is a 61 y.o. male. ? ?The history is provided by the patient.  ?Abdominal Pain ?He has history of hypertension, diabetes, chronic kidney disease, stroke and comes in complaining of epigastric burning for the last 3 days.  He vomited yesterday and vomited today.  He states that he has been compliant with his insulin.  He denies fever or chills.  Denies any dyspnea.  He has not taken anything for the discomfort. ?  ?Home Medications ?Prior to Admission medications   ?Medication Sig Start Date End Date Taking? Authorizing Provider  ?aspirin EC 81 MG tablet Take 81 mg by mouth daily.    [provider]  ?atorvastatin (LIPITOR) 80 MG tablet Take 1 tablet (80 mg total) by mouth daily at 6 PM. 01/19/16   Lavina Hamman, MD  ?benzonatate (TESSALON) 100 MG capsule Take 1 capsule (100 mg total) by mouth 3 (three) times daily as needed for cough. 01/22/18   Donne Hazel, MD  ?Brinzolamide-Brimonidine 1-0.2 % SUSP Place 1 drop into both eyes 3 (three) times daily.    [provider]  ?cholecalciferol (VITAMIN D) 1000 units tablet Take 1,000 Units by mouth daily.    [provider]  ?dorzolamide-timolol (COSOPT) 22.3-6.8 MG/ML ophthalmic solution Place 1 drop into the left eye 2 (two) times daily.    [provider]  ?insulin aspart (NOVOLOG) 100 UNIT/ML injection Inject 10 Units into the skin 3 (three) times daily before meals. 01/19/16   Lavina Hamman, MD  ?insulin glargine (LANTUS) 100 UNIT/ML injection Inject 0.35 mLs (35 Units total) into the skin daily. 04/17/16   Regalado, Belkys A, MD  ?Insulin Pen Needle 31G X 5 MM MISC 1 Device by Does not apply route 3 (three) times daily. 01/22/18   Donne Hazel, MD  ?timolol (TIMOPTIC-XR) 0.5 % ophthalmic gel-forming Place 1  drop into both eyes daily.    [provider]  ?   ? ?Allergies    ?Patient has no known allergies.   ? ?Review of Systems   ?Review of Systems  ?Gastrointestinal:  Positive for abdominal pain.  ?All other systems reviewed and are negative. ? ?Physical Exam ?Updated Vital Signs ?BP (!) 179/72 (BP Location: Left Arm)   Pulse 94   Temp 98.6 ?F (37 ?C) (Oral)   Resp 18   Ht '5\' 10"'$  (1.778 m)   Wt 89.8 kg   SpO2 100%   BMI 28.41 kg/m?  ?Physical Exam ?Vitals and nursing note reviewed.  ?61 year old male, resting comfortably and in no acute distress. Vital signs are significant for elevated blood pressure. Oxygen saturation is 100%, which is normal. ?Head is normocephalic and atraumatic. PERRLA, EOMI. mucous membranes are dry. ?Neck is nontender and supple without adenopathy or JVD. ?Back is nontender and there is no CVA tenderness. ?Lungs are clear without rales, wheezes, or rhonchi. ?Chest is nontender. ?Heart has regular rate and rhythm without murmur. ?Abdomen is soft, flat, with mild epigastric tenderness.  There is no rebound or guarding.  Peristalsis is hypoactive. ?Extremities have no cyanosis or edema, full range of motion is present. ?Skin is warm and dry without rash. ?Neurologic: Mental status is normal, cranial nerves are intact, strength is 5/5 in all 4 extremities. ? ?ED Results / Procedures /  Treatments   ?Labs ?(all labs ordered are listed, but only abnormal results are displayed) ?Labs Reviewed  ?COMPREHENSIVE METABOLIC PANEL - Abnormal; Notable for the following components:  ?    Result Value  ? Sodium 132 (*)   ? Potassium 5.3 (*)   ? Chloride 87 (*)   ? CO2 10 (*)   ? Glucose, Bld 804 (*)   ? BUN 39 (*)   ? Creatinine, Ser 2.77 (*)   ? GFR, Estimated 25 (*)   ? Anion gap 35 (*)   ? All other components within normal limits  ?CBC WITH DIFFERENTIAL/PLATELET - Abnormal; Notable for the following components:  ? RDW 11.3 (*)   ? All other components within normal limits  ?URINALYSIS,  ROUTINE W REFLEX MICROSCOPIC - Abnormal; Notable for the following components:  ? Color, Urine STRAW (*)   ? Glucose, UA >=500 (*)   ? Hgb urine dipstick SMALL (*)   ? Ketones, ur 80 (*)   ? All other components within normal limits  ?BETA-HYDROXYBUTYRIC ACID - Abnormal; Notable for the following components:  ? Beta-Hydroxybutyric Acid >8.00 (*)   ? All other components within normal limits  ?LACTIC ACID, PLASMA - Abnormal; Notable for the following components:  ? Lactic Acid, Venous 4.1 (*)   ? All other components within normal limits  ?BASIC METABOLIC PANEL - Abnormal; Notable for the following components:  ? Sodium 134 (*)   ? Chloride 96 (*)   ? CO2 11 (*)   ? Glucose, Bld 810 (*)   ? BUN 42 (*)   ? Creatinine, Ser 2.97 (*)   ? Calcium 7.9 (*)   ? GFR, Estimated 23 (*)   ? Anion gap 27 (*)   ? All other components within normal limits  ?CBG MONITORING, ED - Abnormal; Notable for the following components:  ? Glucose-Capillary >600 (*)   ? All other components within normal limits  ?CBG MONITORING, ED - Abnormal; Notable for the following components:  ? Glucose-Capillary >600 (*)   ? All other components within normal limits  ?I-STAT VENOUS BLOOD GAS, ED - Abnormal; Notable for the following components:  ? pH, Ven 7.202 (*)   ? pCO2, Ven 30.0 (*)   ? pO2, Ven 70 (*)   ? Bicarbonate 11.8 (*)   ? TCO2 13 (*)   ? Acid-base deficit 15.0 (*)   ? Sodium 126 (*)   ? Potassium 6.8 (*)   ? Calcium, Ion 1.09 (*)   ? All other components within normal limits  ?CBG MONITORING, ED - Abnormal; Notable for the following components:  ? Glucose-Capillary >600 (*)   ? All other components within normal limits  ?CBG MONITORING, ED - Abnormal; Notable for the following components:  ? Glucose-Capillary >600 (*)   ? All other components within normal limits  ?CBG MONITORING, ED - Abnormal; Notable for the following components:  ? Glucose-Capillary >600 (*)   ? All other components within normal limits  ?CBG MONITORING, ED - Abnormal;  Notable for the following components:  ? Glucose-Capillary >600 (*)   ? All other components within normal limits  ?CBG MONITORING, ED - Abnormal; Notable for the following components:  ? Glucose-Capillary >600 (*)   ? All other components within normal limits  ?CBG MONITORING, ED - Abnormal; Notable for the following components:  ? Glucose-Capillary >600 (*)   ? All other components within normal limits  ?CBG MONITORING, ED - Abnormal; Notable for the following components:  ?  Glucose-Capillary 589 (*)   ? All other components within normal limits  ?CBG MONITORING, ED - Abnormal; Notable for the following components:  ? Glucose-Capillary 566 (*)   ? All other components within normal limits  ?LIPASE, BLOOD  ?PROTIME-INR  ?BETA-HYDROXYBUTYRIC ACID  ?BETA-HYDROXYBUTYRIC ACID  ?HIV ANTIBODY (ROUTINE TESTING W REFLEX)  ?BASIC METABOLIC PANEL  ?BASIC METABOLIC PANEL  ?BASIC METABOLIC PANEL  ?BASIC METABOLIC PANEL  ?HEMOGLOBIN A1C  ?LACTIC ACID, PLASMA  ?LACTIC ACID, PLASMA  ?SAMPLE TO BLOOD BANK  ?TROPONIN I (HIGH SENSITIVITY)  ?TROPONIN I (HIGH SENSITIVITY)  ? ? ?EKG ?EKG Interpretation ? ?Date/Time:  Sunday May 16 2021 04:01:53 EDT ?Ventricular Rate:  100 ?PR Interval:  163 ?QRS Duration: 103 ?QT Interval:  370 ?QTC Calculation: 478 ?R Axis:   -37 ?Text Interpretation: Sinus tachycardia Inferior infarct, old When compared with ECG of 01/18/2018, No significant change was found Confirmed by Delora Fuel ([id number]) on 05/16/2021 4:24:38 AM ? ?Radiology ?DG Chest 2 View ? ?Result Date: 05/15/2021 ?CLINICAL DATA:  Chest pain EXAM: CHEST - 2 VIEW COMPARISON:  04/15/2016 FINDINGS: The heart size and mediastinal contours are within normal limits. Both lungs are clear. The visualized skeletal structures are unremarkable. IMPRESSION: No active cardiopulmonary disease. Electronically Signed   By: Ulyses Jarred M.D.   On: 05/15/2021 22:44   ? ?Procedures ?Procedures  ? ? ?Medications Ordered in ED ?Medications  ?insulin regular,  human (MYXREDLIN) 100 units/ 100 mL infusion (8.5 Units/hr Intravenous Rate/Dose Change 05/16/21 0711)  ?lactated ringers infusion ( Intravenous New Bag/Given 05/16/21 0529)  ?dextrose 5 % in lactated ringers infusion

## 2021-05-16 NOTE — Assessment & Plan Note (Addendum)
AKI on CKD vs progression of CKD ?Looks to be acute, creat 1.39 in June 2022 according to [place]. ?Treat DKA as above, avoid nephrotoxins ?Serial BMPs to trend. ?

## 2021-05-16 NOTE — H&P (Signed)
?History and Physical  ? ? ?Patient: Cory Carpenter WUJ:[id number] DOB: 1960-05-18 ?DOA: 05/15/2021 ?DOS: the patient was seen and examined on 05/16/2021 ?PCP: [facility]  ?Patient coming from: Home ? ?Chief Complaint:  ?Chief Complaint  ?Patient presents with  ? Abdominal Pain  ? ?HPI: Cory Carpenter is a 61 y.o. male with medical history significant of CKD, stroke, medication non-adherence, DM. ? ?Pt presents to ED with c/o epigastric burning sensation for past 3 days. ? ?Vomiting yesterday and today. ? ?Tells EDP he has been taking inuslin. ? ?Tells me he last took insulin "a couple of days ago". ? ?No fevers nor chills. ? ?Symptoms persistent, constant. ? 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  Saw [facility] who recd EGD + EBUS, but pt thought they were supposed to call him to schedule this and never did. ? ?Review of Systems: As mentioned in the history of present illness. All other systems reviewed and are negative. ?Past Medical History:  ?Diagnosis Date  ? Diabetes mellitus without complication ([place])   ? Glaucoma   ? Hypertension   ? Narcolepsy   ? Sleep apnea   ? Stroke [facility])   ? ?Past Surgical History:  ?Procedure Laterality Date  ? HAND TENDON SURGERY Left   ? ?Social History:  reports that he has quit smoking. He has never used smokeless tobacco. He reports current alcohol use. He reports that he does not use drugs. ? ?No Known Allergies ? ?Family History  ?Problem Relation Age of Onset  ? Diabetes Mellitus II Mother   ? Kidney disease Mother   ? Stroke Mother   ? ? ?Prior to Admission medications   ?Medication Sig Start Date End Date Taking? Authorizing Provider  ?aspirin EC 81 MG tablet Take 81 mg by mouth daily.   Yes [provider]  ?atorvastatin (LIPITOR) 10 MG tablet Take 5 mg by mouth at bedtime.   Yes [provider]  ?Brinzolamide-Brimonidine 1-0.2 % SUSP Place 1  drop into both eyes 3 (three) times daily.   Yes [provider]  ?cholecalciferol (VITAMIN D) 1000 units tablet Take 1,000 Units by mouth daily.   Yes [provider]  ?hydrochlorothiazide (HYDRODIURIL) 25 MG tablet Take 12.5 mg by mouth daily.   Yes [provider]  ?insulin aspart (NOVOLOG) 100 UNIT/ML injection Inject 10 Units into the skin 3 (three) times daily before meals. ?Patient taking differently: Inject 0-14 Units into the skin See admin instructions. Per sliding scale with meals 01/19/16  Yes Lavina Hamman, MD  ?insulin glargine (LANTUS) 100 UNIT/ML injection Inject 0.35 mLs (35 Units total) into the skin daily. ?Patient taking differently: Inject 42 Units into the skin every evening. 04/17/16  Yes Regalado, Belkys A, MD  ?Insulin Pen Needle 31G X 5 MM MISC 1 Device by Does not apply route 3 (three) times daily. 01/22/18  Yes Donne Hazel, MD  ?Lactobacillus (ACIDOPHILUS PO) Take 2 tablets by mouth daily.   Yes [provider]  ?Netarsudil-Latanoprost (ROCKLATAN) 0.02-0.005 % SOLN Place 1 drop into both eyes every evening.   Yes [provider]  ?ticagrelor (BRILINTA) 90 MG TABS tablet Take 90 mg by mouth 2 (two) times daily.   Yes [provider]  ?timolol (BETIMOL) 0.5 % ophthalmic solution Place 1 drop into both eyes daily.   Yes [provider]  ? ? ?Physical  Exam: ?Vitals:  ? 05/15/21 2159 05/15/21 2200 05/16/21 0222 05/16/21 0223  ?BP:   (!) 159/71 (!) 159/71  ?Pulse:    96  ?Resp:    20  ?Temp:      ?TempSrc:      ?SpO2: 100%   98%  ?Weight:  89.8 kg    ?Height:  '5\' 10"'$  (1.778 m)    ? ?Constitutional: NAD, calm, comfortable ?Eyes: PERRL, lids and conjunctivae normal ?ENMT: Mucous membranes are moist. Posterior pharynx clear of any exudate or lesions.Normal dentition.  ?Neck: normal, supple, no masses, no thyromegaly ?Respiratory: clear to auscultation bilaterally, no wheezing, no crackles. Normal respiratory effort. No accessory  muscle use.  ?Cardiovascular: Regular rate and rhythm, no murmurs / rubs / gallops. No extremity edema. 2+ pedal pulses. No carotid bruits.  ?Abdomen: Mild epigastric TTP ?Musculoskeletal: no clubbing / cyanosis. No joint deformity upper and lower extremities. Good ROM, no contractures. Normal muscle tone.  ?Skin: no rashes, lesions, ulcers. No induration ?Neurologic: CN 2-12 grossly intact. Sensation intact, DTR normal. Strength 5/5 in all 4.  ?Psychiatric: Normal judgment and insight. Alert and oriented x 3. Normal mood.  ? ?Data Reviewed: ? ?  ?CMP  ?   ?Component Value Date/Time  ? NA 126 (L) 05/16/2021 0300  ? K 6.8 (HH) 05/16/2021 0300  ? CL 87 (L) 05/15/2021 2217  ? CO2 10 (L) 05/15/2021 2217  ? GLUCOSE 804 [facility]) 05/15/2021 2217  ? BUN 39 (H) 05/15/2021 2217  ? CREATININE 2.77 (H) 05/15/2021 2217  ? CALCIUM 9.5 05/15/2021 2217  ? PROT 8.1 05/15/2021 2217  ? ALBUMIN 4.2 05/15/2021 2217  ? AST 24 05/15/2021 2217  ? ALT 22 05/15/2021 2217  ? ALKPHOS 105 05/15/2021 2217  ? BILITOT 1.1 05/15/2021 2217  ? GFRNONAA 25 (L) 05/15/2021 2217  ? GFRAA 32 (L) 01/22/2018 0433  ? ?Urinalysis ?   ?Component Value Date/Time  ? COLORURINE STRAW (A) 05/15/2021 2203  ? APPEARANCEUR CLEAR 05/15/2021 2203  ? LABSPEC 1.018 05/15/2021 2203  ? PHURINE 5.0 05/15/2021 2203  ? GLUCOSEU >=500 (A) 05/15/2021 2203  ? HGBUR SMALL (A) 05/15/2021 2203  ? [place] NEGATIVE 05/15/2021 2203  ? KETONESUR 80 (A) 05/15/2021 2203  ? PROTEINUR NEGATIVE 05/15/2021 2203  ? UROBILINOGEN 0.2 02/16/2010 1154  ? NITRITE NEGATIVE 05/15/2021 2203  ? LEUKOCYTESUR NEGATIVE 05/15/2021 2203  ? ? ?VBG ?pH 7.202 ?   ?Component Value Date/Time  ? HCO3 11.8 (L) 05/16/2021 0300  ? TCO2 13 (L) 05/16/2021 0300  ? ACIDBASEDEF 15.0 (H) 05/16/2021 0300  ? O2SAT 90 05/16/2021 0300  ? ? ? ? ?Assessment and Plan: ?* DKA (diabetic ketoacidosis) ([place]) ?DKA pathway ?Suspect secondary to nonadherent to insulin (tells me last took insulin "a couple of days ago") ?BMP  Q4H ?Insulin gtt ?IVF per pathway ?BHB pending ?Note 80 keytones in urine ?PH is 7.2 ? ?Acute renal failure (ARF) (HCC) ?AKI on CKD vs progression of CKD ?Looks to be acute, creat 1.39 in June 2022 according to [place]. ?Treat DKA as above, avoid nephrotoxins ?Serial BMPs to trend. ? ?Abnormal MRI of abdomen ?Abnormal imaging (CT and MRCP) of abdomen in fall last year. ?Pt never ended up getting the recommended EGD + EBUS procedure that GI at [facility] recd he schedule (see office note back in Oct). ?May want to call our GI guys for recs / follow up / performance of recd EGD + EBUS. ?May want to get updated MRCP, though ill hold off on ordering this right now  due to inability to use contrast (renal function). ? ?Essential hypertension ?Hold HCTZ ? ? ? ? ? Advance Care Planning:   Code Status: Full Code ? ?Consults: None ? ?Family Communication: None ? ?Severity of Illness: ?The appropriate patient status for this patient is INPATIENT. Inpatient status is judged to be reasonable and necessary in order to provide the required intensity of service to ensure the patient's safety. The patient's presenting symptoms, physical exam findings, and initial radiographic and laboratory data in the context of their chronic comorbidities is felt to place them at high risk for further clinical deterioration. Furthermore, it is not anticipated that the patient will be medically stable for discharge from the hospital within 2 midnights of admission.  ? ?* I certify that at the point of admission it is my clinical judgment that the patient will require inpatient hospital care spanning beyond 2 midnights from the point of admission due to high intensity of service, high risk for further deterioration and high frequency of surveillance required.* ? ?Author: ?Etta Quill., DO ?05/16/2021 3:57 AM ? ?For on call review [email or web address].  ?

## 2021-05-16 NOTE — Progress Notes (Signed)
Inpatient Diabetes Program Recommendations ? ?AACE/ADA: New Consensus Statement on Inpatient Glycemic Control (2015) ? ?Target Ranges:  Prepandial:   less than 140 mg/dL ?     Peak postprandial:   less than 180 mg/dL (1-2 hours) ?     Critically ill patients:  140 - 180 mg/dL  ? ?Lab Results  ?Component Value Date  ? GLUCAP 203 (H) 05/16/2021  ? HGBA1C 10.7 (H) 01/19/2018  ? ? ?Review of Glycemic Control ? ?Diabetes history: DM ?Outpatient Diabetes medications: Lantus 42 units hs, Novolog 0-14 units tid meal coverage ?Current orders for Inpatient glycemic control: IV insulin ? ?Inpatient Diabetes Program Recommendations:   ?Received consult regarding diabetes management. ?Noted patient has not taken prescribed insulin over the past few days due to nausea.  ?Attempted to call patient on room, cell phone, wife's cell phone but unable to reach @ this time. ?Will followup tomorrow. ? ?Thank you, ?Nani Gasser Rodricus Candelaria, RN, MSN, CDE  ?Diabetes Coordinator ?Inpatient Glycemic Control Team ?Team Pager [phone] (8am-5pm) ?05/16/2021 2:56 PM ? ? ? ? ?

## 2021-05-16 NOTE — Assessment & Plan Note (Addendum)
Abnormal imaging (CT and MRCP) of abdomen in fall last year. ?Pt never ended up getting the recommended EGD + EBUS procedure that GI at [facility] recd he schedule (see office note back in Oct). ?May want to call our GI guys for recs / follow up / performance of recd EGD + EBUS. ?May want to get updated MRCP, though ill hold off on ordering this right now due to inability to use contrast (renal function). ?

## 2021-05-16 NOTE — Progress Notes (Addendum)
Anion gap closed, sugar coming down, pt hungry. Will transition to PO carb modified diet and do a conservative 34 u basal insulin (down from 42 u at home) and 4 u + sliding scale (usually 12-14 u w meals at home). LR stopped, D5 in LR to be given.  ?

## 2021-05-16 NOTE — ED Notes (Signed)
Date and time results received: 05/16/21 5:33 AM ? ?(use smartphrase ".now" to insert current time) ? ?Test: Glucose, Lactic Acid  ?Critical Value: 810, 4.1 (respectively) ? ?Name of Provider Notified: Dr. Gloris Ham ? ?Orders Received? Or Actions Taken?: Orders Received - See Orders for details ?

## 2021-05-16 NOTE — Assessment & Plan Note (Signed)
Hold HCTZ ?

## 2021-05-16 NOTE — Progress Notes (Signed)
?  PROGRESS NOTE ? ?Patient admitted earlier this morning. See H&P.  ? ?Patient had several days of abdominal pain, noncompliance with insulin over the last few days, noncompliance with monitoring sugars.  Found to be in DKA, insulin drip started, anion gap and glucose levels normalizing by the time I saw him.  He reports starting to feel hungry.  No pain. ? ?DKA ? Insulin drip ? Monitor CBG ? BMP every 4 hrs ? LR 150 mL/hour, transition to D5 and LR once glucose is less than 250 ? Lipitor 5 mg nightly ? N.p.o. currently, carb controlled diet when he starts to eat ? ?AKI ? Fluids as above ? Hold hydrochlorothiazide ? Monitor BMP ? ?Essential hypertension ? Hold HCTZ ? ?History of CVA ? Lipitor, aspirin ? ?Abnormal MRI ? Per H&P, had this last year and did not follow-up with GI recommendations ? Will need to do this outpatient ? ?Shelda Pal, DO ?[facility] ?05/16/2021, 11:46 AM ? ?Available via Epic secure chat 7am-7pm ?After these hours, please refer to coverage provider listed on [email or web address]  ? ?

## 2021-05-16 NOTE — Plan of Care (Signed)

## 2021-05-16 NOTE — Assessment & Plan Note (Addendum)
1. DKA pathway ?2. Suspect secondary to nonadherent to insulin (tells me last took insulin "a couple of days ago") ?3. BMP Q4H ?4. Insulin gtt ?5. IVF per pathway ?6. BHB pending ?7. Note 80 keytones in urine ?8. PH is 7.2 ?

## 2021-05-17 DIAGNOSIS — E1110 Type 2 diabetes mellitus with ketoacidosis without coma: Secondary | ICD-10-CM | POA: Diagnosis not present

## 2021-05-17 DIAGNOSIS — R935 Abnormal findings on diagnostic imaging of other abdominal regions, including retroperitoneum: Secondary | ICD-10-CM | POA: Diagnosis not present

## 2021-05-17 DIAGNOSIS — I10 Essential (primary) hypertension: Secondary | ICD-10-CM | POA: Diagnosis not present

## 2021-05-17 DIAGNOSIS — Z8673 Personal history of transient ischemic attack (TIA), and cerebral infarction without residual deficits: Secondary | ICD-10-CM

## 2021-05-17 DIAGNOSIS — N179 Acute kidney failure, unspecified: Secondary | ICD-10-CM | POA: Diagnosis not present

## 2021-05-17 LAB — BASIC METABOLIC PANEL
Anion gap: 21 — ABNORMAL HIGH (ref 5–15)
BUN: 39 mg/dL — ABNORMAL HIGH (ref 6–20)
CO2: 17 mmol/L — ABNORMAL LOW (ref 22–32)
Calcium: 8.4 mg/dL — ABNORMAL LOW (ref 8.9–10.3)
Chloride: 97 mmol/L — ABNORMAL LOW (ref 98–111)
Creatinine, Ser: 2.77 mg/dL — ABNORMAL HIGH (ref 0.61–1.24)
GFR, Estimated: 25 mL/min — ABNORMAL LOW (ref >60)
Glucose, Bld: 541 mg/dL (ref 70–99)
Potassium: 3.7 mmol/L (ref 3.5–5.1)
Sodium: 135 mmol/L (ref 135–145)

## 2021-05-17 LAB — HEMOGLOBIN A1C
Hgb A1c MFr Bld: >15.5 % — ABNORMAL HIGH (ref 4.8–5.6)
Mean Plasma Glucose: >398 mg/dL

## 2021-05-17 MED ORDER — ENSURE ENLIVE PO LIQD: 237.0000 mL | Freq: Two times a day (BID) | ORAL | 12 refills | Status: AC

## 2021-05-17 NOTE — TOC Initial Note (Signed)
Transition of Care (TOC) - Initial/Assessment Note  ? ? ?Patient Details  ?Name: Cory Carpenter ?MRN: [id number] ?Date of Birth: October 12, 1960 ? ?Transition of Care (TOC) CM/SW Contact:    ?Bartholomew Crews, RN ?Phone Number: [phone] ?05/17/2021, 1:20 PM ? ?Clinical Narrative:                 ? ?Spoke with patient and spouse at the bedside. PTA home with spouse, ambulates with cane. Spouse would like patient to have a walker for times that he needs it and is more unsteady.  ? ?Spouse provides transportation for medical appointments. Spouse states that patient is independent, but she is concerned about how he is managing his medications.  ? ?[facility] by [facility] for [facility]. [facility] - Dr. Mina Marble; [place]- Estella Husk [phone] and fax [phone].  ? ?Discussed [place] RN to assist with medication management/disease management. Referral accepted by [facility].  ? ?TOC following for transition needs.  ? ? ?Expected Discharge Plan: [place] ?Barriers to Discharge: No Barriers Identified ? ? ?Patient Goals and CMS Choice ?Patient states their goals for this hospitalization and ongoing recovery are:: return home with wife ?CMS Medicare.gov Compare Post Acute Care list provided to:: Patient ?Choice offered to / list presented to : Spouse ? ?Expected Discharge Plan and Services ?Expected Discharge Plan: [place] ?  ?Discharge Planning Services: CM Consult ?Post Acute Care Choice: Home Health ?Living arrangements for the past 2 months: Apartment ?Expected Discharge Date: 05/17/21               ?DME Arranged: Walker rolling ?  ?  ?  ?  ?HH Arranged: RN ?[place] Agency: [employer] ?Date HH Agency Contacted: 05/17/21 ?Time [place]: [id number] ?Representative spoke with at [place]: Malachy Mood ? ?Prior Living Arrangements/Services ?Living arrangements for the past 2 months: Apartment ?Lives with:: Self, Spouse ?Patient language and need for interpreter reviewed::  Yes ?Do you feel safe going back to the place where you live?: Yes      ?Need for Family Participation in Patient Care: Yes (Comment) ?Care giver support system in place?: Yes (comment) ?Current home services: DME (cane) ?Criminal Activity/Legal Involvement Pertinent to Current Situation/Hospitalization: No - Comment as needed ? ?Activities of Daily Living ?Home Assistive Devices/Equipment: Cane (specify quad or straight) ?ADL Screening (condition at time of admission) ?Patient's cognitive ability adequate to safely complete daily activities?: Yes ?Is the patient deaf or have difficulty hearing?: No ?Does the patient have difficulty seeing, even when wearing glasses/contacts?: Yes ?Does the patient have difficulty concentrating, remembering, or making decisions?: No ?Patient able to express need for assistance with ADLs?: Yes ?Does the patient have difficulty dressing or bathing?: Yes ?Independently performs ADLs?: Yes (appropriate for developmental age) ?Does the patient have difficulty walking or climbing stairs?: No ?Weakness of Legs: Both ?Weakness of Arms/Hands: None ? ?Permission Sought/Granted ?Permission sought to share information with : Family Supports ?  ? Share Information with NAME: Yariel Ferraris ?   ? Permission granted to share info w Relationship: spouse ? Permission granted to share info w Contact Information: [phone] ? ?Emotional Assessment ?  ?  ?  ?Orientation: : Oriented to Self, Oriented to Place, Oriented to  Time, Oriented to Situation ?Alcohol / Substance Use: Not Applicable ?Psych Involvement: No (comment) ? ?Admission diagnosis:  Hyponatremia [E87.1] ?DKA (diabetic ketoacidosis) ([place]) [E11.10] ?Renal insufficiency [N28.9] ?Elevated lipase [R74.8] ?Diabetic ketoacidosis without coma associated with type 1 diabetes mellitus (  [place]) [E10.10] ?Patient Active Problem List  ? Diagnosis Date Noted  ? DKA (diabetic ketoacidosis) ([place]) 05/16/2021  ? Abnormal MRI of abdomen 05/16/2021  ? History  of CVA (cerebrovascular accident) 05/16/2021  ? Cerebral thrombosis with cerebral infarction 01/19/2018  ? Acute renal failure (ARF) ([place]) 01/18/2018  ? Ataxia 01/18/2018  ? Nausea vomiting and diarrhea 01/18/2018  ? 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 Stroke-like symptoms 04/15/2016  ? Bradycardia 04/15/2016  ? Stroke ([place]) 04/15/2016  ? Noncompliance with medications   ? Intracranial carotid stenosis, right   ? CVA (cerebral vascular accident) ([place]) 01/17/2016  ? DM (diabetes mellitus), secondary, uncontrolled, w/neurologic complic 37/16/9678  ? Renal insufficiency 01/17/2016  ? Essential hypertension 10/22/2008  ? PLEURAL EFFUSION 10/22/2008  ? ?PCP:  [facility] ?Pharmacy:   ?[id number] [place], [place] - [address] AT [address]?Phone: [phone]: [phone]? ? ? ? ?Social Determinants of Health (SDOH) Interventions ?  ? ?Readmission Risk Interventions ?No flowsheet data found. ? ? ?

## 2021-05-17 NOTE — Discharge Summary (Signed)
Physician Discharge Summary  ?Patient ID: ?Cory Carpenter ?MRN: [id number] ?DOB/AGE: [birth year] 61 y.o. ? ?Admit date: 05/15/2021 ?Discharge date: 05/17/2021 ? ?Admission Diagnoses: ? ?Discharge Diagnoses:  ?Principal Problem: ?  DKA (diabetic ketoacidosis) ([place]) ?Active Problems: ?  Essential hypertension ?  Acute renal failure (ARF) (HCC) ?  Abnormal MRI of abdomen ?  History of CVA (cerebrovascular accident) ? ? ?Discharged Condition: stable ? ?Hospital Course: Patient is a 61 year old African-American male with past medical history significant for chronic kidney disease, CVA, diabetes mellitus for over 30 years on insulin.  Apparently, patient had been noncompliant and was not compliant with diabetic diet as well.  Patient presented with DKA.  Blood sugar on presentation was 804 with anion gap of 35.  Patient was admitted and managed for DKA.  Patient was aggressively hydrated.  Patient was initially managed with insulin drip.  Anion gap is closed.  Abnormal electrolytes have corrected.  Patient is back to his baseline.  Patient will be discharged back home to the care of the primary care provider. ? ?Previously, MRI and CT scan of the abdomen done late last year revealed concerns for lesion/tumor of the duodenum (near the ampulla).  Patient was supposed to have followed up with [place] team for EGD in April but this never happened.  I have discussed with the patient and patient's wife the need to keep this appointment and they both voiced understanding of the significance. ? ?DKA ?            Insulin drip ?            Monitored CBG ?            BMP every 4 hrs ?             ?  ?AKI ?            Likely prerenal.  Resolved significantly.   ?            Hold hydrochlorothiazide ?            Monitor BMP ?  ?Essential hypertension ?            HCTZ is currently on hold.   ?  ?History of CVA ?            Lipitor, aspirin ?  ?Abnormal MRI and CT scan of the abdomen: ?            We will need to follow-up  with [place] team for EGD and EBUS.   ? ?Consults:  Patient will need to follow-up with [place] team for EGD and EBUS to rule out GI pathology.  This was recommended and, may have been arranged previously but patient could not follow-up. ? ?Significant Diagnostic Studies: labs: On presentation, patient's blood sugar was 804 with an anion gap of 35. ? ? ?Discharge Exam: ?Blood pressure 135/69, pulse (!) 56, temperature 98.7 ?F (37.1 ?C), temperature source Oral, resp. rate 17, height '5\' 10"'$  (1.778 m), weight 89.8 kg, SpO2 97 %. ?  ? ?Disposition: Discharge disposition: 01-Home or Self Care.  Discharge home with home health for medication management/compliance. ? ? ? ? ? ? ?Discharge Instructions   ? ? Diet - low sodium heart healthy   Complete by: As directed ?  ? Increase activity slowly   Complete by: As directed ?  ? Increase activity slowly   Complete by: As directed ?  ? ?  ? ?  Allergies as of 05/17/2021   ?No Known Allergies ?  ? ?  ?Medication List  ?  ? ?STOP taking these medications   ? ?ACIDOPHILUS PO ?  ?hydrochlorothiazide 25 MG tablet ?Commonly known as: HYDRODIURIL ?  ? ?  ? ?TAKE these medications   ? ?aspirin EC 81 MG tablet ?Take 81 mg by mouth daily. ?  ?atorvastatin 10 MG tablet ?Commonly known as: LIPITOR ?Take 5 mg by mouth at bedtime. ?  ?Brinzolamide-Brimonidine 1-0.2 % Susp ?Place 1 drop into both eyes 3 (three) times daily. ?  ?cholecalciferol 1000 units tablet ?Commonly known as: VITAMIN D ?Take 1,000 Units by mouth daily. ?  ?feeding supplement Liqd ?Take 237 mLs by mouth 2 (two) times daily between meals. ?  ?insulin aspart 100 UNIT/ML injection ?Commonly known as: novoLOG ?Inject 10 Units into the skin 3 (three) times daily before meals. ?What changed:  ?how much to take ?when to take this ?additional instructions ?  ?insulin glargine 100 UNIT/ML injection ?Commonly known as: LANTUS ?Inject 0.35 mLs (35 Units total) into the skin daily. ?What changed:   ?how much to take ?when to take this ?  ?Insulin Pen Needle 31G X 5 MM Misc ?1 Device by Does not apply route 3 (three) times daily. ?  ?Rocklatan 0.02-0.005 % Soln ?Generic drug: Netarsudil-Latanoprost ?Place 1 drop into both eyes every evening. ?  ?ticagrelor 90 MG Tabs tablet ?Commonly known as: BRILINTA ?Take 90 mg by mouth 2 (two) times daily. ?  ?timolol 0.5 % ophthalmic solution ?Commonly known as: BETIMOL ?Place 1 drop into both eyes daily. ?  ? ?  ? ? ? ?Signed: ?Bonnell Public ?05/17/2021, 11:10 AM ? ? ?

## 2021-05-17 NOTE — Progress Notes (Signed)
Patients 12 o'clock blood sugar taken via glucose meter and was 286. Glucose meter having issues with transferring into the results. Patient given 4 units plus 8 units of insulin for sliding scale. ?

## 2021-05-17 NOTE — Progress Notes (Signed)
Inpatient Diabetes Program Recommendations ? ?AACE/ADA: New Consensus Statement on Inpatient Glycemic Control  ? ?Target Ranges:  Prepandial:   less than 140 mg/dL ?     Peak postprandial:   less than 180 mg/dL (1-2 hours) ?     Critically ill patients:  140 - 180 mg/dL  ? ? Latest Reference Range & Units 05/16/21 14:08 05/16/21 15:10 05/16/21 16:06 05/16/21 17:08 05/16/21 19:54  ?Glucose-Capillary 70 - 99 mg/dL 203 (H) 177 (H) 157 (H) 153 (H) 227 (H)  ? ? Latest Reference Range & Units 05/16/21 09:31 05/16/21 10:06 05/16/21 11:05 05/16/21 11:52 05/16/21 12:59  ?Glucose-Capillary 70 - 99 mg/dL 409 (H) 394 (H) 361 (H) 312 (H) 234 (H)  ? ?Review of Glycemic Control ? ?Diabetes history: DM2 ?Outpatient Diabetes medications: Lantus 42 units QHS (patient adjust dose based on fasting), Novolog 0-14 units TID with meals (not taking consistently), Ozempic 1 mg Qweek (Sunday; has been out for several months due to pharmacy not having but just refilled last week) ?Current orders for Inpatient glycemic control: Semglee 34 units QHS, Novolog 0-15 units TID with meals, Novolog 0-5 units QHS, Novolog 4 units TID with meals ? ?Inpatient Diabetes Program Recommendations:   ? ?Insulin: Noted patient received Semglee 34 units at 20:13 last night. ? ?NOTE: Diabetes Coordinator working remotely. Called patient via room phone. Patient answered phone and talked with me briefly before handing his wife the phone and telling her to talk to me. Patient's wife states that patient sees an Musician through [place] who manages his DM. She states that patient is prescribed Lantus 42 units QHS but has been instructed to decrease Lantus dose if fasting glucose is normal or increase Lantus dose if fasting is high. Patient is also prescribed Novolog 0-14 units with meals and Ozempic 1 mg Qweek. She reports that the Novolog before meals is the most difficult situation because she reminds patient to take the Novolog before meals and he gets upset  with her and she feels he is not taking it very often. She reports that the pharmacy was out of [place] for several months and they just received the Ozempic so patient has not restarted it yet because he came to the hospital. She reports that patient is not taking insulin consistently and that he is very difficult at home when she tries to help him with DM or ask him about taking his DM medications. She reports that he goes to his room to take medications so she is not sure if he takes them or not. She notes that patient also "cheats" all the time with what he is eating. She also reports that patient's glucometer has not been working because it needed a battery and she just found out about it and was able to get him a new glucometer which he has at home waiting on him. She states that she goes to all patient's appointments with him and she has let his providers know that he is not taking medications consistently. She states that she tries her best to help him but it is a "very difficult situation at home".  Discussed Lantus and how it works. Explained that usually if Lantus is taken at night and fasting glucose is within target glucose ranges then the Lantus dose would be continued but if patient has any hypoglycemia at all then the dose would be decreased. She states that patient does not have any hypoglycemia and that his glucose is usually high. Encouraged her to talk with patient's Endocrinologist  about Lantus and when to adjust.  She reports his last A1C was 11%. Discussed A1C and glucose goals. Explained how hyperglycemia leads to damage within blood vessels which lead to the common complications seen with uncontrolled diabetes. Stressed the importance of improving glycemic control to prevent further complications from uncontrolled diabetes. Encouraged patient's wife to see if patient would perhaps allow her to watch him take insulin dosages (or allow her to give to him) especially the Lantus once a day to  ensure he is consistently getting basal insulin. Also encouraged patient's wife to be sure to let his Endocrinologist know how difficult patient is at home and see if there may be any other options for DM management to help with compliance. Patient's wife verbalized understanding of information discussed and reports no further questions at this time related to diabetes. ? ?Thanks, ?Barnie Alderman, RN, MSN, CDE ?Diabetes Coordinator ?Inpatient Diabetes Program ?[phone] (Team Pager) ? ? ? ?

## 2021-05-20 LAB — GLUCOSE, CAPILLARY
Glucose-Capillary: 229 mg/dL — ABNORMAL HIGH (ref 70–99)
Glucose-Capillary: 286 mg/dL — ABNORMAL HIGH (ref 70–99)

## 2021-08-02 ENCOUNTER — Telehealth: Payer: Self-pay | Admitting: Gastroenterology

## 2021-08-02 NOTE — Telephone Encounter (Signed)
Maddie can you make sure Dr Rush Landmark gets the records and referral reason for this pt?

## 2021-08-02 NOTE — Telephone Encounter (Signed)
I am the hospital doctor and not sure when I will be coming to the clinic.  DJ is out of town for the next week and a half.  I will need some sort of synopsis into what the referral is.  Can someone please look through the notation and help me understand.  I will try to pass by at some point in the next 1 to 2 days to see about further triaging? Thanks. GM

## 2021-08-02 NOTE — Telephone Encounter (Signed)
Do we have any records? There is not any information in the chart.

## 2021-08-02 NOTE — Telephone Encounter (Signed)
Cory Carpenter, the referral is listed under the media tab.

## 2021-08-02 NOTE — Telephone Encounter (Signed)
Good Morning Dr. Ardis Hughs  and Dr. Rush Landmark,  Patient called stating that he had a referral sent over to have EUS and ERCP procedures. I spoke with Patty and she wanted me to send the message to both of you to discuss moving with procedures. Will you please advise on scheduling this patient?  Thank you.

## 2021-08-04 NOTE — Telephone Encounter (Signed)
I have sent a message to the [place] via fax with recommendations per Dr Rush Landmark.  Will await response.

## 2021-08-04 NOTE — Telephone Encounter (Signed)
I have reviewed the media tab referral. Patient's referral is scanned into the media chart.  Referral for EUS/ERCP/enteroscopy Patient history from July 2022 note 80-monthfollow-up of a prominent ampulla via CT scanning Lung bases with tiny focus of consolidative atelectasis.  Otherwise unremarkable lung bases.  Normal heart. Liver grossly unremarkable. Gallbladder in place.  No biliary dilation. Adrenal glands unremarkable. Kidneys with cysts left and right without nephrolithiasis. Spleen normal size. Pancreas with minimal focal prominence of the pancreatic duct in the mid body without overt dilatation up to 3 mm.  Pancreas is otherwise unremarkable. Bowel as a 1.4 cm in maximal axial dimension low-density nodule along the nondependent aspect of the descending duodenum at the level of the ampulla which is identified.  This measures 19 mm from superior to inferior extent it appears polypoid, extending from the left anterior, left lateral descending duodenal wall on the coronal images.  Small intestine is otherwise unremarkable.  Moderate amount of formed stool present throughout the entire colon.  Retrocecal appendix is normal. Pelvis normal size prostate and under distended bladder. Nonaneurysmal abdominal aorta.  Extensive atherosclerosis small and medium vessels throughout the pelvis and proximal extremities. Body wall with ill-defined areas of induration of upper subcutaneous abdominal wall possibly reflecting scarring. Peritoneum and retroperitoneum with no free fluid or lymph node enlargement. Old bone posterior lateral left seventh rib fracture deformity with focal areas of demineralization of the right iliac wing. Impression nonspecific 19 mm polypoid low-density intraluminal soft tissue nodule of the descending duodenum at the level of the ampulla.  Malignancy not excluded.  Clinically correlate.  2021 outside EGD/EUS (not clear where this was done) Prominent ampulla no mass, biopsies  taken.  Question redundant fold for submucosal lesion adjacent to ampulla status postbiopsy.  Recommendations advance diet as tolerated, await biopsy results, follow-up with referring physician, repeat MRI/MRCP in 6 months, EUS findings the pancreas parenchyma and uncinate was homogenous and normal salt-and-pepper appearance.  The parenchyma in the head of the pancreas was homogenous with salt-and-pepper appearance.  The parenchyma in the body and tail was homogenous salt-and-pepper.  The pancreas was mildly dilated and measured 5 mm in maximal diameter in the head of the pancreas and 1.7 mm in maximum diameter of the body of the pancreas.  The duct was normal in echotexture and contour.  No intraductal stones were noted.  No dilated side branches were noted.  Portal vein, splenic vein and portal splenic confluence were imaged and normal.  SMV was normal.  The bile duct was imaged at the level of the porta hepatis and head of pancreas and ampulla.  The maximum diameter bile duct 5 mm.  Bile duct was normal in appearance.  No stones or sludge were noted.  The bile duct and pancreatic duct were imaged within the ampulla and appeared normal.  No ampullary mass was seen.  Side-viewing duodena scope was then used to visualize the ampulla.  The ampulla was prominent status post biopsy.  Question redundant fold for submucosal lesion adjacent to ampulla status postbiopsy.  The next steps in evaluation for this patient are the following: 1) patient CT scan needs to be uploaded into our PACS/canopy system 2) as it has been over a year since any imaging has been done this needs to be updated before an endoscopic ultrasound is completed and I would recommend if no contraindication that the patient undergo an MRI/MRCP versus a pancreas protocol CT abdomen 3) patient is seen in clinic by DJ or myself 4) EUS  can be offered at that time based on repeat imaging results and history from patient 5) find out where the November  2021 EGD/EUS was performed if patient recalls  Thanks. GM

## 2021-08-13 NOTE — Telephone Encounter (Signed)
The updated information that was placed on my desk has not added anything more.  It shows the full EUS report done by Dr. Delrae Alfred of [facility] advanced endoscopy (though looks to have been done at the [place]) back in 2021. No new imaging since 2022. Recommendations remain the same as follows: 1) patient can be scheduled with Dr. Ardis Hughs or myself in clinic 2) patient should undergo a MRI/MRCP versus pancreas protocol CT abdomen 3) based on those results of imaging and discussion in clinic we will decide about potential repeat EUS needs  Patty, You can please schedule this patient as a nonurgent patient with Dr. Ardis Hughs or myself.  As the patient has previously been seen by [facility] providers, they can also have the opportunity to return back to the [facility] team who has done procedures on him previously.  I will put these records to be scanned into the chart as well and so that they are available for Dr. Ardis Hughs or myself in future if necessary.   Justice Britain, MD [facility] Advanced Endoscopy Office # [phone]

## 2021-08-16 NOTE — Telephone Encounter (Signed)
Awaiting response from the [place] in regards to the MRI MRCP and appt.

## 2021-08-17 NOTE — Telephone Encounter (Signed)
Another letter has been faxed to the [facility]

## 2021-08-17 NOTE — Telephone Encounter (Signed)
Call placed to the [place] to inquire about referral ok.   I have been on hold for 15 min and still 15 inline.  I will attempt call later.  I did previously fax request to the [place]. I will refax now as well.

## 2021-08-18 NOTE — Telephone Encounter (Signed)
I spoke with the pt wife and she tells me that they are going to [facility] for care.

## 2021-08-18 NOTE — Telephone Encounter (Signed)
Thank you for update. Please close this referral. GM

## 2021-10-22 ENCOUNTER — Telehealth: Payer: Self-pay | Admitting: Gastroenterology

## 2021-10-22 NOTE — Telephone Encounter (Signed)
Inbound cal from patient wife stating they would like to proceed with referral. Please give a call back to further advise.  Thank you

## 2021-10-22 NOTE — Telephone Encounter (Signed)
The pt and his wife have been advised that a referral will need to be entered by the referring MD.  They will discuss with the [facility] and primary GI and have them reach out.

## 2021-11-09 NOTE — Telephone Encounter (Signed)
Cory Billow do you have a new referral from the [place] for this pt?  There was one sent in May but the pt decided to see [facility] so that referral was closed.  They now want to come here and a new referral is needed.  I am not able to any referral in [place].  Thank you

## 2021-11-09 NOTE — Telephone Encounter (Signed)
Dr Rush Landmark can you review? The pt has decided to have the procedure with our practice.

## 2021-11-09 NOTE — Telephone Encounter (Signed)
Patty, I am happy to evaluate the patient in clinic if he and his wife still want to move forward with the referral. As previously discussed the patient needs to have his previous cross-sectional imaging uploaded into our PACS/imaging system of canopy. Patient will need MRI/MRCP if no contraindication to be performed versus a pancreas protocol CT abdomen. Patient will then need to be seen in clinic. Based on what we find and review on the imaging report we will discuss the role of endoscopic ultrasound. Please let the patient and wife know that based on her current availability any endoscopic procedures are likely not going to be pursued until at least November based on her availability and so if they still want to pursue evaluation here we are happy to see them otherwise they can go to [facility] as they had previously expressed interest in doing. Let me know what they decide on. Thanks. GM

## 2021-11-09 NOTE — Telephone Encounter (Signed)
Inbound call from patient wife ready to schedule patient for GI advanced endoscopic EUS, ERCP, Enteroscopy.  Please give a call back to further advise.  Thank you

## 2021-11-09 NOTE — Telephone Encounter (Signed)
Cory Carpenter, We are still able to use the referral authorization from May, it's valid till November and the patient has decided to continue with the referral. Thanks

## 2021-11-11 NOTE — Telephone Encounter (Signed)
Left message on machine to call back  

## 2021-11-12 NOTE — Telephone Encounter (Signed)
Left message on machine to call back  

## 2021-11-15 ENCOUNTER — Telehealth: Payer: Self-pay | Admitting: Gastroenterology

## 2021-11-15 NOTE — Telephone Encounter (Signed)
I have been unable to reach the pt wife by phone in regards to referral.  I will wait further communication from the pt/wife

## 2021-11-15 NOTE — Telephone Encounter (Signed)
Left message on machine to call back  

## 2021-11-15 NOTE — Telephone Encounter (Signed)
Patient returned your call. Requesting a call back. Please  call to advise. 

## 2021-11-22 NOTE — Telephone Encounter (Signed)
Left message on machine to call back          Tivis Wherry, I am happy to evaluate the patient in clinic if he and his wife still want to move forward with the referral. As previously discussed the patient needs to have his previous cross-sectional imaging uploaded into our PACS/imaging system of canopy. Patient will need MRI/MRCP if no contraindication to be performed versus a pancreas protocol CT abdomen. Patient will then need to be seen in clinic. Based on what we find and review on the imaging report we will discuss the role of endoscopic ultrasound. Please let the patient and wife know that based on her current availability any endoscopic procedures are likely not going to be pursued until at least November based on her availability and so if they still want to pursue evaluation here we are happy to see them otherwise they can go to [facility] as they had previously expressed interest in doing. Let me know what they decide on. Thanks. GM

## 2021-11-22 NOTE — Telephone Encounter (Signed)
Left message on machine to call back  

## 2021-11-23 NOTE — Telephone Encounter (Signed)
Several messages have been left for pt/wife to call back to discuss.  I will await further communication from the pt

## 2022-02-02 ENCOUNTER — Encounter: Payer: Self-pay | Admitting: Gastroenterology

## 2022-02-02 ENCOUNTER — Ambulatory Visit (INDEPENDENT_AMBULATORY_CARE_PROVIDER_SITE_OTHER): Payer: No Typology Code available for payment source | Admitting: Gastroenterology

## 2022-02-02 VITALS — BP 124/78 | HR 80 | Ht 70.0 in | Wt 207.6 lb

## 2022-02-02 DIAGNOSIS — K8689 Other specified diseases of pancreas: Secondary | ICD-10-CM | POA: Diagnosis not present

## 2022-02-02 DIAGNOSIS — R194 Change in bowel habit: Secondary | ICD-10-CM

## 2022-02-02 DIAGNOSIS — K625 Hemorrhage of anus and rectum: Secondary | ICD-10-CM | POA: Diagnosis not present

## 2022-02-02 DIAGNOSIS — Z7902 Long term (current) use of antithrombotics/antiplatelets: Secondary | ICD-10-CM

## 2022-02-02 DIAGNOSIS — R933 Abnormal findings on diagnostic imaging of other parts of digestive tract: Secondary | ICD-10-CM | POA: Diagnosis not present

## 2022-02-02 DIAGNOSIS — R198 Other specified symptoms and signs involving the digestive system and abdomen: Secondary | ICD-10-CM

## 2022-02-02 DIAGNOSIS — K648 Other hemorrhoids: Secondary | ICD-10-CM

## 2022-02-02 DIAGNOSIS — K5900 Constipation, unspecified: Secondary | ICD-10-CM

## 2022-02-02 MED ORDER — HYDROCORTISONE ACETATE 25 MG RE SUPP: 25.0000 mg | Freq: Two times a day (BID) | RECTAL | 1 refills | Status: AC

## 2022-02-02 MED ORDER — PEG-KCL-NACL-NASULF-NA ASC-C 100 G PO SOLR: 1.0000 | Freq: Once | ORAL | 0 refills | Status: AC

## 2022-02-02 NOTE — Progress Notes (Signed)
[place] VISIT   Primary Care Provider Clinic, [place] [address] [phone]  Referring Provider Clinic, [facility] [address] [place],  [place] [address] [phone]  Patient Profile: Cory Carpenter is a 61 y.o. 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  The patient presents to the [facility] for an evaluation and management of problem(s) noted below:  Problem List 1. Dilated pancreatic duct   2. Abnormal findings on esophagogastroduodenoscopy (EGD)   3. Abnormal pancreas imaging   4. BRBPR (bright red blood per rectum)   5. Change in bowel habit   6. Constipation, unspecified constipation type   7. Internal hemorrhoids   8. Antiplatelet or antithrombotic long-term use     History of Present Illness This is the patient's first visit to the outpatient [facility].  He has previously been followed by the [facility] in [place].  He is also been seen by [facility] and also had [facility] advanced endoscopy evaluation.  The patient is referred by the [facility] at this time for further evaluation surveillance of her previous abnormality found in the duodenum.  In brief, the patient's history began back in 2022 when outside imaging was performed and showed concern for prominence of the pancreas duct on a CT.  This was followed up with an MRI which showed a papillary projection along the area of the ampulla.  At that time, the patient was then referred for a endoscopic ultrasound which was performed that did not show evidence of an ampullary mass but a possible submucosal lesion adjacent to it.  Biopsies were reported to be normal.  He has not had any follow-up  since.  The patient was referred earlier this year but had deferred by discussion with him and his wife per notation in our chart to go back [facility].  For unclear reasons, the patient and wife then decided to proceed to evaluation here in [place] and now the patient was scheduled initially with my partner Dr. Ardis Hughs and then now with me.  Talking with the patient today he was not completely clear of the reason that was here in clinic today.  He describes having increased bloating and gassiness based on the foods that he eats.  He deals with constipation in the setting of having hard stools daily.  He has noted over the course the last 2 to 3 months increasing blood in the toilet and toilet paper with nearly every single bowel movement.  He is having anal discomfort as the stools passed.  His stools are noted to be brown per his report.  He was recently initiated on a fiber supplement.  He has had colonoscopy screenings for history of colon polyps in the past and believes his last one was approximately 5 years ago and that his wife states he is due this coming year for his colonoscopy.  He has not had any unintentional weight loss.  There has not been any evidence of jaundice or darkened urine.  GI Review of Systems Positive as above Negative for pyrosis, dysphagia, odynophagia, nausea, vomiting, melena  Review of Systems General: Denies fevers/chills/weight loss unintentionally Cardiovascular: Denies chest pain Pulmonary: Denies shortness of breath Gastroenterological: See HPI Genitourinary: Denies darkened urine Hematological: Denies easy bruising/bleeding Endocrine: Denies temperature intolerance Dermatological: Denies jaundice  Psychological: Mood is stable   Medications Current Outpatient Medications  Medication Sig Dispense Refill   aspirin EC 81 MG tablet Take 81 mg by mouth daily.     atorvastatin (LIPITOR) 10 MG tablet Take 5 mg by mouth at bedtime.     Brinzolamide-Brimonidine  1-0.2 % SUSP Place 1 drop into both eyes 3 (three) times daily.     cholecalciferol (VITAMIN D) 1000 units tablet Take 1,000 Units by mouth daily.     hydrocortisone (ANUSOL-HC) 25 MG suppository Place 1 suppository (25 mg total) rectally every 12 (twelve) hours. 12 suppository 1   insulin aspart (NOVOLOG) 100 UNIT/ML injection Inject 10 Units into the skin 3 (three) times daily before meals. (Patient taking differently: Inject 0-14 Units into the skin See admin instructions. Per sliding scale with meals) 10 mL 11   insulin glargine (LANTUS) 100 UNIT/ML injection Inject 0.35 mLs (35 Units total) into the skin daily. (Patient taking differently: Inject 42 Units into the skin every evening.) 10 mL 11   Insulin Pen Needle 31G X 5 MM MISC 1 Device by Does not apply route 3 (three) times daily. 100 each 0   Netarsudil-Latanoprost (ROCKLATAN) 0.02-0.005 % SOLN Place 1 drop into both eyes every evening.     ticagrelor (BRILINTA) 90 MG TABS tablet Take 90 mg by mouth 2 (two) times daily.     timolol (BETIMOL) 0.5 % ophthalmic solution Place 1 drop into both eyes daily.     feeding supplement (ENSURE ENLIVE / ENSURE PLUS) LIQD Take 237 mLs by mouth 2 (two) times daily between meals. (Patient not taking: Reported on 02/02/2022) 237 mL 12   No current facility-administered medications for this visit.    Allergies No Known Allergies  Histories Past Medical History:  Diagnosis Date   Diabetes mellitus without complication (HCC)    Glaucoma    Hypertension    Narcolepsy    Sleep apnea    Stroke [facility])    Past Surgical History:  Procedure Laterality Date   HAND TENDON SURGERY Left    Social History   Socioeconomic History   Marital status: Married    Spouse name: Not on file   Number of children: Not on file   Years of education: Not on file   Highest education level: Not on file  Occupational History   Occupation: retired  Tobacco Use   Smoking status: Former   Smokeless tobacco: Never   Scientific laboratory technician Use: Never used  Substance and Sexual Activity   Alcohol use: Yes    Comment: occ    Drug use: No   Sexual activity: Yes  Other Topics Concern   Not on file  Social History Narrative   Not on file   Social Determinants of Health   Financial Resource Strain: Not on file  Food Insecurity: Not on file  Transportation Needs: Not on file  Physical Activity: Not on file  Stress: Not on file  Social Connections: Not on file  Intimate Partner Violence: Not on file   Family History  Problem Relation Age of Onset   Diabetes Mellitus II Mother    Kidney disease Mother    Stroke Mother    Colon cancer Neg Hx    Esophageal cancer Neg Hx    Stomach cancer Neg Hx    Colon polyps Neg Hx    Inflammatory bowel disease Neg Hx    Liver disease Neg Hx    Pancreatic cancer Neg Hx  Rectal cancer Neg Hx    I have reviewed his medical, social, and family history in detail and updated the electronic medical record as necessary.    PHYSICAL EXAMINATION  BP 124/78   Pulse 80   Ht _0  (1.778 m)   Wt 207 lb 9.6 oz (94.2 kg)   SpO2 96%   BMI 29.79 kg/m  Wt Readings from Last 3 Encounters:  02/02/22 207 lb 9.6 oz (94.2 kg)  05/15/21 198 lb (89.8 kg)  01/22/18 191 lb 12.8 oz (87 kg)  GEN: NAD, appears stated age, doesn't appear chronically ill, accompanied by wife PSYCH: Cooperative, without pressured speech EYE: Conjunctivae pink, sclerae anicteric ENT: MMM CV: Nontachycardic RESP: No audible wheezing GI: NABS, soft, protuberant abdomen, rounded, NT, without rebound or guarding GU: Normal perineal exam, no evidence of external hemorrhoids, internal hemorrhoids palpated, brown stool in the vault, no other palpable lesions on DRE today MSK/EXT: Trace bilateral pedal edema SKIN: No jaundice NEURO:  Alert & Oriented x 3, no focal deficits   REVIEW OF DATA  I reviewed the following data at the time of this encounter:  GI Procedures and Studies  August 2021  upper EUS outside provider Prominent ampulla without mass, biopsies taken Redundant fold versus submucosal lesion adjacent to ampulla status post biopsy Plan for repeat MRI/MRCP at 50-monthmark The pancreatic parenchyma in the uncinate pancreas was homogenous with a normal salt-and-pepper appearance.  The parenchyma in the head of the pancreas was homogenous with a normal salt-and-pepper appearance.  The parenchyma in the body and partially imaged tail was homogenous with a normal salt-and-pepper appearance.  The pancreas duct was mildly dilated and measures 5 mm in maximum diameter in the head of pancreas and 1.7 mm in maximal diameter in the body of pancreas.  The duct was normal in echotexture and contour.  No intraductal stones were noted.  No dilated sidebranches were noted.  Peripancreatic vasculature was normal.  Bile duct was imaged and was 5 mm.  No intrinsic stones or sludge were noted.  No ampullary mass was seen.  The ampulla was prominent status post biopsy with a redundant fold or submucosal lesion adjacent to the ampulla.  Laboratory Studies  Reviewed those in epic  Imaging Studies  [facility] [place] do not have access to any of those and it has been greater than 2 years since last imaging studies were performed   ASSESSMENT  Mr. THarriottis a 61y.o. 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  The patient is seen today for evaluation and management of:  1. Dilated pancreatic duct   2. Abnormal findings on esophagogastroduodenoscopy (EGD)   3. Abnormal pancreas imaging   4. BRBPR (bright red blood per rectum)   5. Change in bowel habit   6. Constipation, unspecified constipation type   7. Internal hemorrhoids   8. Antiplatelet or antithrombotic long-term use    The patient  is hemodynamically stable.  Clinically from the initial reason that the patient was referred for community care GI in regards to the previously noted dilated pancreas duct and potential subepithelial lesion adjacent to the ampulla, additional workup is reasonable to consider.  Patient needs updated imaging however as he has not had that in over 2 years.  Will plan to proceed with an MRI/MRCP.  This  will then help guide our plans for an upper endoscopic ultrasound to reevaluate the area.  In the setting of the patient's asymptomatic nature from here and normal liver tests that we have been told, it is not clear if we will need to do anything more than monitor this area.  In regards to the patient's bright red blood per rectum that he has been experiencing with change in bowel habits and more significant constipation and history of colon polyps, he needs updated colonoscopy.  Will plan to perform both upper EUS and colonoscopy at the same time.  Will plan to pursue through this and my next available slot to do both procedures after we have an opportunity to review his MRI/MRCP.  Will need to get his antiplatelet hold from his primary care provider/neurologist and my team will work on getting that completed.  Depending on results of colonoscopy will determine next steps in his evaluation but we will use hydrocortisone/Anusol suppositories to try to help the patient's likely hemorrhoidal bleeding.  Laboratories will be obtained today as well.  The risks of an EUS including intestinal perforation, bleeding, infection, aspiration, and medication effects were discussed as was the possibility it may not give a definitive diagnosis if a biopsy is performed.  When a biopsy of the pancreas is done as part of the EUS, there is an additional risk of pancreatitis at the rate of about 1-2%.  It was explained that procedure related pancreatitis is typically mild, although it can be severe and even life threatening, which is why we  do not perform random pancreatic biopsies and only biopsy a lesion/area we feel is concerning enough to warrant the risk.  The risks and benefits of endoscopic evaluation were discussed with the patient; these include but are not limited to the risk of perforation, infection, bleeding, missed lesions, lack of diagnosis, severe illness requiring hospitalization, as well as anesthesia and sedation related illnesses.  The patient and/or family is agreeable to proceed.     PLAN  Laboratories as outlined below MRI/MRCP to be obtained Proceed with scheduling upper EUS to evaluate the previously noted dilated PD/periampullary SEL Anusol suppositories nightly x 1 week and every other night until completed to help with likely hemorrhoidal bleeding FiberCon or fiber supplementation should be used daily MiraLAX daily as needed Toileting techniques discussed Proceed with scheduling surveillance colonoscopy at same time as upper EUS   Orders Placed This Encounter  Procedures   Procedural/ Surgical Case Request: COLONOSCOPY WITH PROPOFOL, UPPER ESOPHAGEAL ENDOSCOPIC ULTRASOUND (EUS)   MR ABDOMEN MRCP W WO CONTAST   CBC   Comp Met (CMET)   Calcium, ionized   TSH   INR/PT   Ambulatory referral to Gastroenterology    New Prescriptions   HYDROCORTISONE (ANUSOL-HC) 25 MG SUPPOSITORY    Place 1 suppository (25 mg total) rectally every 12 (twelve) hours.   Modified Medications   No medications on file    Planned Follow Up No follow-ups on file.   Total Time in Face-to-Face and in Coordination of Care for patient including independent/personal interpretation/review of prior testing, medical history, examination, medication adjustment, communicating results with the patient directly, and documentation within the EHR is 50 minutes.   Justice Britain, MD [facility] Advanced Endoscopy Office # [phone]

## 2022-02-02 NOTE — Patient Instructions (Addendum)
We have sent the following medications to your pharmacy for you to pick up at your convenience: [facility], [place] will be contacted by [facility] in the next 2 days to arrange a MRI/ MRCP  The number on your caller ID will be [phone], please answer when they call.  If you have not heard from them in 2 days please call [phone] to schedule.    Your provider has requested that you go to the basement level for lab work before leaving today. Press "B" on the elevator. The lab is located at the first door on the left as you exit the elevator.   Start Fiber Supplement once daily.    Use Anusol Suppositories - Insert 1 rectally nightly for 7 nights, then use 1 suppository every other night until completion of prescription.   You have been scheduled for a colonoscopy. Please follow written instructions given to you at your visit today.  Please pick up your prep supplies at the pharmacy within the next 1-3 days. If you use inhalers (even only as needed), please bring them with you on the day of your procedure.  Due to recent changes in healthcare laws, you may see the results of your imaging and laboratory studies on MyChart before your provider has had a chance to review them.  We understand that in some cases there may be results that are confusing or concerning to you. Not all laboratory results come back in the same time frame and the provider may be waiting for multiple results in order to interpret others.  Please give [place] 48 hours in order for your provider to thoroughly review all the results before contacting the office for clarification of your results.   Thank you for choosing me and [facility].  Dr. Rush Landmark

## 2022-02-06 DIAGNOSIS — Z7902 Long term (current) use of antithrombotics/antiplatelets: Secondary | ICD-10-CM | POA: Insufficient documentation

## 2022-02-06 DIAGNOSIS — R198 Other specified symptoms and signs involving the digestive system and abdomen: Secondary | ICD-10-CM | POA: Insufficient documentation

## 2022-02-06 DIAGNOSIS — R194 Change in bowel habit: Secondary | ICD-10-CM | POA: Insufficient documentation

## 2022-02-06 DIAGNOSIS — K5900 Constipation, unspecified: Secondary | ICD-10-CM | POA: Insufficient documentation

## 2022-02-06 DIAGNOSIS — K8689 Other specified diseases of pancreas: Secondary | ICD-10-CM | POA: Insufficient documentation

## 2022-02-06 DIAGNOSIS — K625 Hemorrhage of anus and rectum: Secondary | ICD-10-CM | POA: Insufficient documentation

## 2022-02-06 DIAGNOSIS — R933 Abnormal findings on diagnostic imaging of other parts of digestive tract: Secondary | ICD-10-CM | POA: Insufficient documentation

## 2022-02-06 DIAGNOSIS — K648 Other hemorrhoids: Secondary | ICD-10-CM | POA: Insufficient documentation

## 2022-03-07 ENCOUNTER — Ambulatory Visit (INDEPENDENT_AMBULATORY_CARE_PROVIDER_SITE_OTHER): Payer: Medicare Other

## 2022-03-07 DIAGNOSIS — K625 Hemorrhage of anus and rectum: Secondary | ICD-10-CM

## 2022-03-07 DIAGNOSIS — K5900 Constipation, unspecified: Secondary | ICD-10-CM

## 2022-03-07 DIAGNOSIS — R933 Abnormal findings on diagnostic imaging of other parts of digestive tract: Secondary | ICD-10-CM

## 2022-03-07 DIAGNOSIS — R198 Other specified symptoms and signs involving the digestive system and abdomen: Secondary | ICD-10-CM

## 2022-03-07 DIAGNOSIS — R194 Change in bowel habit: Secondary | ICD-10-CM

## 2022-03-07 DIAGNOSIS — K648 Other hemorrhoids: Secondary | ICD-10-CM

## 2022-03-07 MED ORDER — GADOBUTROL 1 MMOL/ML IV SOLN
10.0000 mL | Freq: Once | INTRAVENOUS | Status: AC | PRN
  Administered 2022-03-07: 10 mL via INTRAVENOUS

## 2022-03-10 ENCOUNTER — Telehealth: Payer: Self-pay | Admitting: Gastroenterology

## 2022-03-10 NOTE — Telephone Encounter (Signed)
The patient has been notified of this information and all questions answered.  The pt will keep appt as scheduled.

## 2022-03-10 NOTE — Telephone Encounter (Signed)
See results note. 

## 2022-03-10 NOTE — Telephone Encounter (Signed)
Patient's wife returned call, in regards to results from MRI. Please advise.

## 2022-03-10 NOTE — Telephone Encounter (Signed)
Cory Carpenter, Please let the patient know that I have reviewed the MRI/MRCP. Things look to be overall stable from prior imaging being reviewed by radiologist. However there is a subtle lesion that seems similar to what had been described previously and so we need to evaluate this area again with EUS. He is already scheduled for that later this month. No change in our plan otherwise and we will get some further information after we do his EGD/EUS. Thanks. GM

## 2022-03-14 ENCOUNTER — Encounter (HOSPITAL_COMMUNITY): Payer: Self-pay | Admitting: Gastroenterology

## 2022-03-14 ENCOUNTER — Telehealth: Payer: Self-pay

## 2022-03-14 NOTE — Telephone Encounter (Signed)
Clearance was faxed to the [place] [facility] on 02/04/22. Re-faxed again on 02/14/22  and 03/14/2022. I called and spoke with patient's spouse and asked that they reach out to the [facility] to see if they can get someone to respond or a contact number that can be reached. She states that she will call tomorrow and let me know. If we do not get clearance back by Wed 03/16/22, patient will need to be r/s. Wife voiced understanding.

## 2022-03-14 NOTE — Progress Notes (Signed)
Attempted to obtain medical history via telephone, unable to reach at this time. HIPAA compliant voicemail message left requesting return call to pre surgical testing department.  

## 2022-03-15 NOTE — Telephone Encounter (Signed)
Recd clearance back [facility] - Per Dr. Addison Lank patient can hold Brilinta 5 days prior to procedure. Patient's wife has been informed and voiced understanding.

## 2022-03-19 NOTE — Anesthesia Preprocedure Evaluation (Addendum)
Anesthesia Evaluation  Patient identified by MRN, date of birth, ID band Patient awake    Reviewed: Allergy & Precautions, NPO status , Patient's Chart, lab work & pertinent test results  History of Anesthesia Complications Negative for: history of anesthetic complications  Airway Mallampati: II  TM Distance: >3 FB Neck ROM: Full    Dental   Pulmonary sleep apnea , former smoker   breath sounds clear to auscultation       Cardiovascular hypertension,  Rhythm:Regular Rate:Normal     Neuro/Psych CVA    GI/Hepatic negative GI ROS, Neg liver ROS,,,  Endo/Other  diabetes    Renal/GU negative Renal ROS     Musculoskeletal negative musculoskeletal ROS (+)    Abdominal   Peds  Hematology negative hematology ROS (+)   Anesthesia Other Findings   Reproductive/Obstetrics                             Anesthesia Physical Anesthesia Plan  ASA: 3  Anesthesia Plan: MAC   Post-op Pain Management:    Induction:   PONV Risk Score and Plan: 1 and Propofol infusion  Airway Management Planned: Natural Airway  Additional Equipment:   Intra-op Plan:   Post-operative Plan:   Informed Consent: I have reviewed the patients History and Physical, chart, labs and discussed the procedure including the risks, benefits and alternatives for the proposed anesthesia with the patient or authorized representative who has indicated his/her understanding and acceptance.       Plan Discussed with: Anesthesiologist  Anesthesia Plan Comments:        Anesthesia Quick Evaluation

## 2022-03-21 ENCOUNTER — Ambulatory Visit (HOSPITAL_COMMUNITY)
Admission: RE | Admit: 2022-03-21 | Discharge: 2022-03-21 | Disposition: A | Discharge status: Home / Self Care | Payer: No Typology Code available for payment source | Attending: Gastroenterology | Admitting: Gastroenterology

## 2022-03-21 ENCOUNTER — Other Ambulatory Visit: Payer: Self-pay

## 2022-03-21 ENCOUNTER — Ambulatory Visit (HOSPITAL_BASED_OUTPATIENT_CLINIC_OR_DEPARTMENT_OTHER): Payer: No Typology Code available for payment source | Admitting: Anesthesiology

## 2022-03-21 ENCOUNTER — Encounter (HOSPITAL_COMMUNITY)
Admission: RE | Disposition: A | Discharge status: Home / Self Care | Payer: Self-pay | Source: Home / Self Care | Attending: Gastroenterology

## 2022-03-21 ENCOUNTER — Encounter (HOSPITAL_COMMUNITY): Payer: Self-pay | Admitting: Gastroenterology

## 2022-03-21 ENCOUNTER — Ambulatory Visit (HOSPITAL_COMMUNITY): Payer: No Typology Code available for payment source | Admitting: Anesthesiology

## 2022-03-21 DIAGNOSIS — K625 Hemorrhage of anus and rectum: Secondary | ICD-10-CM | POA: Insufficient documentation

## 2022-03-21 DIAGNOSIS — K635 Polyp of colon: Secondary | ICD-10-CM | POA: Diagnosis not present

## 2022-03-21 DIAGNOSIS — Z86010 Personal history of colonic polyps: Secondary | ICD-10-CM | POA: Insufficient documentation

## 2022-03-21 DIAGNOSIS — K317 Polyp of stomach and duodenum: Secondary | ICD-10-CM

## 2022-03-21 DIAGNOSIS — K648 Other hemorrhoids: Secondary | ICD-10-CM

## 2022-03-21 DIAGNOSIS — K5730 Diverticulosis of large intestine without perforation or abscess without bleeding: Secondary | ICD-10-CM | POA: Diagnosis not present

## 2022-03-21 DIAGNOSIS — K869 Disease of pancreas, unspecified: Secondary | ICD-10-CM | POA: Diagnosis not present

## 2022-03-21 DIAGNOSIS — R198 Other specified symptoms and signs involving the digestive system and abdomen: Secondary | ICD-10-CM

## 2022-03-21 DIAGNOSIS — Z87891 Personal history of nicotine dependence: Secondary | ICD-10-CM

## 2022-03-21 DIAGNOSIS — D124 Benign neoplasm of descending colon: Secondary | ICD-10-CM | POA: Diagnosis not present

## 2022-03-21 DIAGNOSIS — I899 Noninfective disorder of lymphatic vessels and lymph nodes, unspecified: Secondary | ICD-10-CM | POA: Diagnosis not present

## 2022-03-21 DIAGNOSIS — K641 Second degree hemorrhoids: Secondary | ICD-10-CM | POA: Diagnosis not present

## 2022-03-21 DIAGNOSIS — K839 Disease of biliary tract, unspecified: Secondary | ICD-10-CM

## 2022-03-21 DIAGNOSIS — K2289 Other specified disease of esophagus: Secondary | ICD-10-CM | POA: Diagnosis not present

## 2022-03-21 DIAGNOSIS — D120 Benign neoplasm of cecum: Secondary | ICD-10-CM | POA: Diagnosis not present

## 2022-03-21 DIAGNOSIS — I10 Essential (primary) hypertension: Secondary | ICD-10-CM

## 2022-03-21 DIAGNOSIS — R933 Abnormal findings on diagnostic imaging of other parts of digestive tract: Secondary | ICD-10-CM | POA: Diagnosis not present

## 2022-03-21 DIAGNOSIS — G4730 Sleep apnea, unspecified: Secondary | ICD-10-CM | POA: Diagnosis not present

## 2022-03-21 DIAGNOSIS — Z1211 Encounter for screening for malignant neoplasm of colon: Secondary | ICD-10-CM | POA: Diagnosis present

## 2022-03-21 DIAGNOSIS — K2950 Unspecified chronic gastritis without bleeding: Secondary | ICD-10-CM | POA: Diagnosis not present

## 2022-03-21 DIAGNOSIS — K5900 Constipation, unspecified: Secondary | ICD-10-CM | POA: Diagnosis not present

## 2022-03-21 DIAGNOSIS — Z8673 Personal history of transient ischemic attack (TIA), and cerebral infarction without residual deficits: Secondary | ICD-10-CM | POA: Diagnosis not present

## 2022-03-21 DIAGNOSIS — Z09 Encounter for follow-up examination after completed treatment for conditions other than malignant neoplasm: Secondary | ICD-10-CM | POA: Diagnosis not present

## 2022-03-21 DIAGNOSIS — K2970 Gastritis, unspecified, without bleeding: Secondary | ICD-10-CM

## 2022-03-21 DIAGNOSIS — K3189 Other diseases of stomach and duodenum: Secondary | ICD-10-CM | POA: Diagnosis not present

## 2022-03-21 DIAGNOSIS — D123 Benign neoplasm of transverse colon: Secondary | ICD-10-CM | POA: Insufficient documentation

## 2022-03-21 DIAGNOSIS — E119 Type 2 diabetes mellitus without complications: Secondary | ICD-10-CM | POA: Insufficient documentation

## 2022-03-21 DIAGNOSIS — K838 Other specified diseases of biliary tract: Secondary | ICD-10-CM

## 2022-03-21 DIAGNOSIS — R194 Change in bowel habit: Secondary | ICD-10-CM

## 2022-03-21 DIAGNOSIS — K929 Disease of digestive system, unspecified: Secondary | ICD-10-CM | POA: Insufficient documentation

## 2022-03-21 HISTORY — PX: POLYPECTOMY: SHX5525

## 2022-03-21 HISTORY — PX: BIOPSY: SHX5522

## 2022-03-21 HISTORY — PX: EUS: SHX5427

## 2022-03-21 HISTORY — PX: ESOPHAGOGASTRODUODENOSCOPY (EGD) WITH PROPOFOL: SHX5813

## 2022-03-21 HISTORY — PX: COLONOSCOPY WITH PROPOFOL: SHX5780

## 2022-03-21 LAB — GLUCOSE, CAPILLARY
Glucose-Capillary: 116 mg/dL — ABNORMAL HIGH (ref 70–99)
Glucose-Capillary: 110 mg/dL — ABNORMAL HIGH (ref 70–99)

## 2022-03-21 SURGERY — ESOPHAGOGASTRODUODENOSCOPY (EGD) WITH PROPOFOL
Anesthesia: Monitor Anesthesia Care

## 2022-03-21 MED ORDER — PROPOFOL 500 MG/50ML IV EMUL
INTRAVENOUS | Status: DC | PRN
  Administered 2022-03-21: 125 ug/kg/min via INTRAVENOUS

## 2022-03-21 MED ORDER — SODIUM CHLORIDE 0.9 % IV SOLN: INTRAVENOUS | Status: DC

## 2022-03-21 MED ORDER — PROPOFOL 10 MG/ML IV BOLUS
INTRAVENOUS | Status: AC
  Filled 2022-03-21: qty 20

## 2022-03-21 MED ORDER — EPHEDRINE SULFATE-NACL 50-0.9 MG/10ML-% IV SOSY
PREFILLED_SYRINGE | INTRAVENOUS | Status: DC | PRN
  Administered 2022-03-21: 10 mg via INTRAVENOUS
  Administered 2022-03-21 (×2): 5 mg via INTRAVENOUS

## 2022-03-21 MED ORDER — PROPOFOL 500 MG/50ML IV EMUL
INTRAVENOUS | Status: AC
  Filled 2022-03-21: qty 50

## 2022-03-21 MED ORDER — TICAGRELOR 90 MG PO TABS: 90.0000 mg | ORAL_TABLET | Freq: Two times a day (BID) | ORAL | Status: AC

## 2022-03-21 MED ORDER — LACTATED RINGERS IV SOLN: INTRAVENOUS | Status: DC | PRN

## 2022-03-21 MED ORDER — PROPOFOL 10 MG/ML IV BOLUS
INTRAVENOUS | Status: DC | PRN
  Administered 2022-03-21: 30 mg via INTRAVENOUS

## 2022-03-21 MED ORDER — SODIUM CHLORIDE 0.9 % IV SOLN
INTRAVENOUS | Status: AC | PRN
  Administered 2022-03-21: 1000 mL via INTRAVENOUS

## 2022-03-21 SURGICAL SUPPLY — 22 items

## 2022-03-21 NOTE — Anesthesia Procedure Notes (Signed)
Procedure Name: MAC Date/Time: 03/21/2022 9:05 AM  Performed by: Lollie Sails, CRNAPre-anesthesia Checklist: Patient identified, Emergency Drugs available, Suction available, Patient being monitored and Timeout performed Oxygen Delivery Method: Simple face mask Preoxygenation: POM used. Placement Confirmation: positive ETCO2

## 2022-03-21 NOTE — Transfer of Care (Signed)
Immediate Anesthesia Transfer of Care Note  Patient: Cory Carpenter  Procedure(s) Performed: COLONOSCOPY WITH PROPOFOL UPPER ENDOSCOPIC ULTRASOUND (EUS) LINEAR ESOPHAGOGASTRODUODENOSCOPY (EGD) WITH PROPOFOL BIOPSY UPPER ENDOSCOPIC ULTRASOUND (EUS) RADIAL  Patient Location: PACU and Endoscopy Unit  Anesthesia Type:MAC  Level of Consciousness: awake  Airway & Oxygen Therapy: Patient Spontanous Breathing  Post-op Assessment: Report given to RN and Post -op Vital signs reviewed and stable  Post vital signs: Reviewed and stable  Last Vitals:  Vitals Value Taken Time  BP 171/117 03/21/22 1044  Temp    Pulse 90 03/21/22 1045  Resp 25 03/21/22 1045  SpO2 92 % 03/21/22 1045  Vitals shown include unvalidated device data.  Last Pain:  Vitals:   03/21/22 0746  TempSrc: Temporal  PainSc: 0-No pain         Complications: No notable events documented.

## 2022-03-21 NOTE — Op Note (Signed)
[facility] Patient Name: Cory Carpenter Procedure Date: 03/21/2022 MRN: [id number] Attending MD: Justice Britain , MD, [id number] Date of Birth: March 21, 1960 CSN: [id number] Age: 62 Admit Type: Outpatient Procedure:                Colonoscopy Indications:              High risk colon cancer surveillance: Personal                            history of colonic polyps, Incidental - Rectal                            bleeding Providers:                Justice Britain, MD, Carlyn Reichert, RN, William Dalton, Technician, Alfonso Patten CRNA, CRNA Referring MD:             [facility] Medicines:                Monitored Anesthesia Care Complications:            No immediate complications. Estimated Blood Loss:     Estimated blood loss was minimal. Procedure:                Pre-Anesthesia Assessment:                           - Prior to the procedure, a History and Physical                            was performed, and patient medications and                            allergies were reviewed. The patient's tolerance of                            previous anesthesia was also reviewed. The risks                            and benefits of the procedure and the sedation                            options and risks were discussed with the patient.                            All questions were answered, and informed consent                            was obtained. Prior Anticoagulants: The patient has                            taken Brilinta (ticagrelor), last dose was 5 days  prior to procedure. ASA Grade Assessment: III - A                            patient with severe systemic disease. After                            reviewing the risks and benefits, the patient was                            deemed in satisfactory condition to undergo the                            procedure.                           After  obtaining informed consent, the colonoscope                            was passed under direct vision. Throughout the                            procedure, the patient's blood pressure, pulse, and                            oxygen saturations were monitored continuously. The                            CF-HQ190L ([id number]) Olympus colonoscope was                            introduced through the anus and advanced to the the                            cecum, identified by appendiceal orifice and                            ileocecal valve. The colonoscopy was performed                            without difficulty. The patient tolerated the                            procedure. The quality of the bowel preparation was                            adequate. The ileocecal valve, appendiceal orifice,                            and rectum were photographed. Scope In: 10:10:16 AM Scope Out: 10:32:51 AM Scope Withdrawal Time: 0 hours 17 minutes 13 seconds  Total Procedure Duration: 0 hours 22 minutes 35 seconds  Findings:      The digital rectal exam findings include hemorrhoids. Pertinent       negatives include no palpable rectal lesions.      Nine sessile  polyps were found in the descending colon (1), transverse       colon (4), hepatic flexure (1) and cecum (3). The polyps were 3 to 12 mm       in size. These polyps were removed with a cold snare. Resection and       retrieval were complete.      Normal mucosa was found in the entire colon otherwise.      Non-bleeding non-thrombosed internal hemorrhoids were found during       retroflexion, during perianal exam and during digital exam. The       hemorrhoids were Grade II (internal hemorrhoids that prolapse but reduce       spontaneously). Impression:               - Hemorrhoids found on digital rectal exam.                           - Nine 3 to 12 mm polyps in the descending colon,                            in the transverse colon, at the  hepatic flexure and                            in the cecum, removed with a cold snare. Resected                            and retrieved.                           - Normal mucosa in the entire examined colon                            otherwise.                           - Non-bleeding non-thrombosed internal hemorrhoids. Moderate Sedation:      Not Applicable - Patient had care per Anesthesia. Recommendation:           - The patient will be observed post-procedure,                            until all discharge criteria are met.                           - Discharge patient to home.                           - Patient has a contact number available for                            emergencies. The signs and symptoms of potential                            delayed complications were discussed with the  patient. Return to normal activities tomorrow.                            Written discharge instructions were provided to the                            patient.                           - High fiber diet.                           - Use FiberCon 1-2 tablets PO daily.                           - Restart Brilanta on 1/24 AM to decrease risk of                            post-interventional bleeding.                           - Continue present medications.                           - Await pathology results.                           - Repeat colonoscopy in 3 years for surveillance.                           - The findings and recommendations were discussed                            with the patient.                           - The findings and recommendations were discussed                            with the patient's family. Procedure Code(s):        --- Professional ---                           [id number], Colonoscopy, flexible; with removal of                            tumor(s), polyp(s), or other lesion(s) by snare                             technique Diagnosis Code(s):        --- Professional ---                           Z86.010, Personal history of colonic polyps                           D12.4, Benign neoplasm of descending colon  D12.3, Benign neoplasm of transverse colon (hepatic                            flexure or splenic flexure)                           D12.0, Benign neoplasm of cecum                           K64.1, Second degree hemorrhoids CPT copyright 2022 American [employer]. All rights reserved. The codes documented in this report are preliminary and upon coder review may  be revised to meet current compliance requirements. Justice Britain, MD 03/21/2022 10:50:25 AM Number of Addenda: 0

## 2022-03-21 NOTE — Discharge Instructions (Signed)
YOU HAD AN ENDOSCOPIC PROCEDURE TODAY: Refer to the procedure report and other information in the discharge instructions given to you for any specific questions about what was found during the examination. If this information does not answer your questions, please call [place] office at [phone] to clarify.  ° °YOU SHOULD EXPECT: Some feelings of bloating in the abdomen. Passage of more gas than usual. Walking can help get rid of the air that was put into your GI tract during the procedure and reduce the bloating. If you had a lower endoscopy (such as a colonoscopy or flexible sigmoidoscopy) you may notice spotting of blood in your stool or on the toilet paper. Some abdominal soreness may be present for a day or two, also. ° °DIET: Your first meal following the procedure should be a light meal and then it is ok to progress to your normal diet. A half-sandwich or bowl of soup is an example of a good first meal. Heavy or fried foods are harder to digest and may make you feel nauseous or bloated. Drink plenty of fluids but you should avoid alcoholic beverages for 24 hours. If you had a esophageal dilation, please see attached instructions for diet.   ° °ACTIVITY: Your care partner should take you home directly after the procedure. You should plan to take it easy, moving slowly for the rest of the day. You can resume normal activity the day after the procedure however YOU SHOULD NOT DRIVE, use power tools, machinery or perform tasks that involve climbing or major physical exertion for 24 hours (because of the sedation medicines used during the test).  ° °SYMPTOMS TO REPORT IMMEDIATELY: °A gastroenterologist can be reached at any hour. Please call [phone]  for any of the following symptoms:  °Following lower endoscopy (colonoscopy, flexible sigmoidoscopy) °Excessive amounts of blood in the stool  °Significant tenderness, worsening of abdominal pains  °Swelling of the abdomen that is new, acute  °Fever of 100° or  higher  °Following upper endoscopy (EGD, EUS, ERCP, esophageal dilation) °Vomiting of blood or coffee ground material  °New, significant abdominal pain  °New, significant chest pain or pain under the shoulder blades  °Painful or persistently difficult swallowing  °New shortness of breath  °Black, tarry-looking or red, bloody stools ° °FOLLOW UP:  °If any biopsies were taken you will be contacted by phone or by letter within the next 1-3 weeks. Call [phone]  if you have not heard about the biopsies in 3 weeks.  °Please also call with any specific questions about appointments or follow up tests. ° °

## 2022-03-21 NOTE — H&P (Signed)
GASTROENTEROLOGY PROCEDURE H&P NOTE   Primary Care Physician: [facility]  HPI: Cory Carpenter is a 62 y.o. male who presents for EGD/EUS to evaluate a periampullary lesion, ectatic PD, and for Colonoscopy for colon cancer screening/colon polyp surveillance.  Past Medical History:  Diagnosis Date   Diabetes mellitus without complication (HCC)    Glaucoma    Hypertension    Narcolepsy    Sleep apnea    Stroke [facility])    Past Surgical History:  Procedure Laterality Date   HAND TENDON SURGERY Left    Current Facility-Administered Medications  Medication Dose Route Frequency Provider Last Rate Last Admin   0.9 %  sodium chloride infusion   Intravenous Continuous Mansouraty, Telford Nab., MD        Current Facility-Administered Medications:    0.9 %  sodium chloride infusion, , Intravenous, Continuous, Mansouraty, Telford Nab., MD No Known Allergies Family History  Problem Relation Age of Onset   Diabetes Mellitus II Mother    Kidney disease Mother    Stroke Mother    Colon cancer Neg Hx    Esophageal cancer Neg Hx    Stomach cancer Neg Hx    Colon polyps Neg Hx    Inflammatory bowel disease Neg Hx    Liver disease Neg Hx    Pancreatic cancer Neg Hx    Rectal cancer Neg Hx    Social History   Socioeconomic History   Marital status: Married    Spouse name: Not on file   Number of children: Not on file   Years of education: Not on file   Highest education level: Not on file  Occupational History   Occupation: retired  Tobacco Use   Smoking status: Former   Smokeless tobacco: Never  Scientific laboratory technician Use: Never used  Substance and Sexual Activity   Alcohol use: Yes    Comment: occ    Drug use: No   Sexual activity: Yes  Other Topics Concern   Not on file  Social History Narrative   Not on file   Social Determinants of Health   Financial Resource Strain: Not on file  Food Insecurity: Not on file  Transportation Needs: Not on file  Physical  Activity: Not on file  Stress: Not on file  Social Connections: Not on file  Intimate Partner Violence: Not on file    Physical Exam: Today's Vitals   03/14/22 1140  Weight: 93.9 kg   Body mass index is 29.7 kg/m. GEN: NAD EYE: Sclerae anicteric ENT: MMM CV: Non-tachycardic GI: Soft, NT/ND NEURO:  Alert & Oriented x 3  Lab Results: No results for input(s): "WBC", "HGB", "HCT", "PLT" in the last 72 hours. BMET No results for input(s): "NA", "K", "CL", "CO2", "GLUCOSE", "BUN", "CREATININE", "CALCIUM" in the last 72 hours. LFT No results for input(s): "PROT", "ALBUMIN", "AST", "ALT", "ALKPHOS", "BILITOT", "BILIDIR", "IBILI" in the last 72 hours. PT/INR No results for input(s): "LABPROT", "INR" in the last 72 hours.   Impression / Plan: This is a 62 y.o.male who presents for EGD/EUS to evaluate a periampullary lesion, ectatic PD, and for Colonoscopy for colon cancer screening/colon polyp surveillance.  The risks of an EUS including intestinal perforation, bleeding, infection, aspiration, and medication effects were discussed as was the possibility it may not give a definitive diagnosis if a biopsy is performed.  When a biopsy of the pancreas is done as part of the EUS, there is an additional risk of pancreatitis at the rate  of about 1-2%.  It was explained that procedure related pancreatitis is typically mild, although it can be severe and even life threatening, which is why we do not perform random pancreatic biopsies and only biopsy a lesion/area we feel is concerning enough to warrant the risk.   The risks and benefits of endoscopic evaluation/treatment were discussed with the patient and/or family; these include but are not limited to the risk of perforation, infection, bleeding, missed lesions, lack of diagnosis, severe illness requiring hospitalization, as well as anesthesia and sedation related illnesses.  The patient's history has been reviewed, patient examined, no change in  status, and deemed stable for procedure.  The patient and/or family is agreeable to proceed.    Justice Britain, MD [facility] Advanced [facility]oscopy Office # [phone]

## 2022-03-21 NOTE — Op Note (Signed)
[facility] Patient Name: Cory Carpenter Procedure Date: 03/21/2022 MRN: [id number] Attending MD: Justice Britain , MD, [id number] Date of Birth: 1960/12/21 CSN: [id number] Age: 62 Admit Type: Outpatient Procedure:                Upper EUS Indications:              Duodenal mucosal mass/polyp found on endoscopy,                            Mucosal mass/polyp involving the major papilla                            (found on endoscopy), Submucosal tumor versus                            extrinsic mass found on endoscopy Providers:                Justice Britain, MD, Carlyn Reichert, RN, William Dalton, Technician, [name] CRNA, CRNA Referring MD:             [facility] Medicines:                Monitored Anesthesia Care Complications:            No immediate complications. Estimated Blood Loss:     Estimated blood loss was minimal. Procedure:                Pre-Anesthesia Assessment:                           - Prior to the procedure, a History and Physical                            was performed, and patient medications and                            allergies were reviewed. The patient's tolerance of                            previous anesthesia was also reviewed. The risks                            and benefits of the procedure and the sedation                            options and risks were discussed with the patient.                            All questions were answered, and informed consent                            was obtained. Prior Anticoagulants: The patient has  taken Brilinta (ticagrelor), last dose was 5 days                            prior to procedure. ASA Grade Assessment: III - A                            patient with severe systemic disease. After                            reviewing the risks and benefits, the patient was                            deemed in satisfactory  condition to undergo the                            procedure.                           After obtaining informed consent, the endoscope was                            passed under direct vision. Throughout the                            procedure, the patient's blood pressure, pulse, and                            oxygen saturations were monitored continuously. The                            [id number]-[id number] ([id number]) Olympus endoscope was introduced                            through the mouth, and advanced to the second part                            of duodenum. The [id number] ([id number]) Olympus                            duodenoscope was introduced through the mouth, and                            advanced to the area of papilla. The [id number]                            ([id number]) Olympus linear ultrasound scope was                            introduced through the mouth, and advanced to the                            duodenum for ultrasound examination from the  stomach and duodenum. The upper EUS was                            accomplished without difficulty. The patient                            tolerated the procedure. Scope In: Scope Out: Findings:      ENDOSCOPIC FINDING: :      No gross lesions were noted in the entire esophagus.      The Z-line was irregular and was found 38 cm from the incisors.      Patchy mildly erythematous mucosa without bleeding was found in the       entire examined stomach. Biopsies were taken with a cold forceps for       histology and Helicobacter pylori testing.      A single 10 mm subepithelial nodule was found in the area of the minor       papilla.      A single 10 mm subepithelial nodule was found in the area of the papilla.      No significant gross lesions were noted in the major papilla (other than       a large intraduodenal portion).      Normal mucosa was found in the visualized duodenal bulb, in the first        portion of the duodenum and in the second portion of the duodenum.      ENDOSONOGRAPHIC FINDING: :      Endosonographic imaging of the ampulla showed no evidence of a true       intramural (subepithelial) lesion.      A round intramural (subepithelial) lesion was found in the area of just       adjacent to the major papilla, within the duodenum. The lesion was       hypoechoic. Endosonographically, the lesion appeared to originate from       within the muscularis propria (Layer 4). The lesion measured 11 mm (in       maximum thickness). The lesion also measured 5 mm in diameter. The outer       margins were well defined. Adequate positioning for attempt at       fine-needle aspiration/fine-needle biopsy was not able to be maintained       today with the Linear EUS scope.      An oval intramural (subepithelial) lesion was found in the area of minor       papilla, within the duodenum. The lesion was hypoechoic.       Endosonographically, the lesion appeared to originate from within the       deep mucosa (Layer 2). The lesion measured 12 mm (in maximum thickness).       The lesion also measured 12 mm in diameter. The outer margins were well       defined. Adequate positioning for attempt at fine-needle       aspiration/fine-needle biopsy was not able to be maintained today with       the LInear EUS scope.      Pancreatic parenchymal abnormalities were noted in the entire pancreas.       These consisted of lobularity without honeycombing and hyperechoic       strands.      The pancreatic duct was visualized and noted to be prominent/slightly  dilated in the pancreatic head (4.0 mm -> 3.6 mm), genu of the pancreas       (2.6 mm), body of the pancreas (1.8 mm) and tail of the pancreas (1.1       mm).      There was no sign of significant endosonographic abnormality in the       common bile duct (4.4 mm) and in the common hepatic duct (6.3 mm). An       unremarkable gallbladder was  identified.      Endosonographic imaging in the visualized portion of the liver showed no       mass.      No malignant-appearing lymph nodes were visualized in the celiac region       (level 20), peripancreatic region and porta hepatis region.      The celiac region was visualized. Impression:               EGD impression:                           - No gross lesions in the entire esophagus. Z-line                            irregular, 38 cm from the incisors.                           - Erythematous mucosa in the stomach. Biopsied.                           - Subepithelial nodule found in the region of the                            minor papilla.                           - Subepithelial nodule found in the region of the                            major papilla.                           - No overt gross lesions of the major papilla                            (large intraduodenal portion noted).                           - Otherwise, normal mucosa was found in the                            duodenal bulb, in the first portion of the duodenum                            and in the second portion of the duodenum.                           EUS impression:                           -  An intramural (subepithelial) lesion was found in                            the area of major papilla, within the duodenum. The                            lesion appeared to originate from within the                            muscularis propria (Layer 4).                           - An intramural (subepithelial) lesion was found in                            the area of minor papilla, within the duodenum. The                            lesion appeared to originate from within the deep                            mucosa (Layer 2).                           - The ampulla itself did not have any intramural                            lesion.                           - Pancreatic parenchymal abnormalities  consisting                            of lobularity and hyperechoic strands were noted in                            the entire pancreas.                           - The pancreatic duct in the pancreatic head, genu                            of the pancreas, body of the pancreas and tail of                            the pancreas.                           - There was no sign of significant pathology in the                            common bile duct and in the common hepatic duct.                           -  No malignant-appearing lymph nodes were                            visualized in the celiac region (level 20),                            peripancreatic region and porta hepatis region. Moderate Sedation:      Not Applicable - Patient had care per Anesthesia. Recommendation:           - Proceed to scheduled colonoscopy.                           - Observe patient's clinical course.                           - Plan for repeat MRI/MRCP in 12 months to follow                            along these areas. If there is concern for                            significant change then we will consider repeat EUS                            and another attempted sampling versus sending this                            patient to a quaternary center for attempt at                            sampling.                           - Await path results.                           - The findings and recommendations were discussed                            with the patient.                           - The findings and recommendations were discussed                            with the patient's family. Procedure Code(s):        --- Professional ---                           [phone], Esophagogastroduodenoscopy, flexible,                            transoral; with endoscopic ultrasound examination                            limited to the esophagus, stomach or  duodenum, and                             adjacent structures                           43239, Esophagogastroduodenoscopy, flexible,                            transoral; with biopsy, single or multiple Diagnosis Code(s):        --- Professional ---                           K22.89, Other specified disease of esophagus                           K31.89, Other diseases of stomach and duodenum                           K86.9, Disease of pancreas, unspecified                           I89.9, Noninfective disorder of lymphatic vessels                            and lymph nodes, unspecified                           K83.9, Disease of biliary tract, unspecified                           K92.9, Disease of digestive system, unspecified                           R93.3, Abnormal findings on diagnostic imaging of                            other parts of digestive tract CPT copyright 2022 American Medical Association. All rights reserved. The codes documented in this report are preliminary and upon coder review may  be revised to meet current compliance requirements. Justice Britain, MD 03/21/2022 11:05:56 AM Number of Addenda: 0

## 2022-03-22 ENCOUNTER — Encounter (HOSPITAL_COMMUNITY): Payer: Self-pay | Admitting: Gastroenterology

## 2022-03-22 LAB — SURGICAL PATHOLOGY

## 2022-03-22 NOTE — Anesthesia Postprocedure Evaluation (Signed)
Anesthesia Post Note  Patient: Cory Carpenter  Procedure(s) Performed: COLONOSCOPY WITH PROPOFOL ESOPHAGOGASTRODUODENOSCOPY (EGD) WITH PROPOFOL BIOPSY UPPER ENDOSCOPIC ULTRASOUND (EUS) RADIAL POLYPECTOMY     Patient location during evaluation: PACU Anesthesia Type: MAC Level of consciousness: awake and alert Pain management: pain level controlled Vital Signs Assessment: post-procedure vital signs reviewed and stable Respiratory status: spontaneous breathing, nonlabored ventilation, respiratory function stable and patient connected to nasal cannula oxygen Cardiovascular status: stable and blood pressure returned to baseline Postop Assessment: no apparent nausea or vomiting Anesthetic complications: no  No notable events documented.  Last Vitals:  Vitals:   03/21/22 1125 03/21/22 1129  BP: (!) 186/67 (!) 178/69  Pulse: 66 (!) 59  Resp: 17 (!) 21  Temp:    SpO2: 97% 99%    Last Pain:  Vitals:   03/21/22 1129  TempSrc:   PainSc: 0-No pain                 Tiajuana Amass

## 2022-03-24 ENCOUNTER — Encounter: Payer: Self-pay | Admitting: Gastroenterology

## 2023-03-27 ENCOUNTER — Telehealth: Payer: Self-pay

## 2023-03-27 NOTE — Telephone Encounter (Signed)
I have not been able to reach pt by phone.  The number states no voice mail set up.  I will mail a letter.

## 2023-03-27 NOTE — Telephone Encounter (Signed)
-----   Message from Nurse Azhane Eckart P sent at 03/25/2022  9:05 AM EST ----- 1 year MRI/MRCP recall.

## 2023-04-28 ENCOUNTER — Other Ambulatory Visit: Payer: Self-pay
